# Patient Record
Sex: Female | Born: 1945 | Race: White | Hispanic: No | Marital: Married | Smoking: Never smoker
Health system: Southern US, Community
[De-identification: ages and names within clinical notes are randomized; demographics above are authoritative.]

## PROBLEM LIST (undated history)

## (undated) DIAGNOSIS — J45909 Unspecified asthma, uncomplicated: Secondary | ICD-10-CM

## (undated) DIAGNOSIS — M329 Systemic lupus erythematosus, unspecified: Secondary | ICD-10-CM

## (undated) DIAGNOSIS — N6019 Diffuse cystic mastopathy of unspecified breast: Secondary | ICD-10-CM

## (undated) DIAGNOSIS — I10 Essential (primary) hypertension: Secondary | ICD-10-CM

## (undated) DIAGNOSIS — C801 Malignant (primary) neoplasm, unspecified: Secondary | ICD-10-CM

## (undated) DIAGNOSIS — I4891 Unspecified atrial fibrillation: Secondary | ICD-10-CM

## (undated) DIAGNOSIS — IMO0002 Reserved for concepts with insufficient information to code with codable children: Secondary | ICD-10-CM

## (undated) HISTORY — DX: Diffuse cystic mastopathy of unspecified breast: N60.19

## (undated) HISTORY — PX: BREAST BIOPSY: SHX20

## (undated) HISTORY — DX: Unspecified asthma, uncomplicated: J45.909

## (undated) HISTORY — PX: ABDOMINAL HYSTERECTOMY: SHX81

## (undated) HISTORY — DX: Systemic lupus erythematosus, unspecified: M32.9

## (undated) HISTORY — PX: BREAST EXCISIONAL BIOPSY: SUR124

## (undated) HISTORY — DX: Reserved for concepts with insufficient information to code with codable children: IMO0002

## (undated) HISTORY — PX: FACIAL COSMETIC SURGERY: SHX629

## (undated) HISTORY — PX: AUGMENTATION MAMMAPLASTY: SUR837

## (undated) HISTORY — PX: BREAST CYST ASPIRATION: SHX578

---

## 2000-03-21 HISTORY — PX: MITRAL VALVE REPAIR: SHX2039

## 2004-03-21 HISTORY — PX: CHOLECYSTECTOMY: SHX55

## 2004-03-30 ENCOUNTER — Ambulatory Visit: Payer: Self-pay | Admitting: Family Medicine

## 2004-04-15 ENCOUNTER — Inpatient Hospital Stay: Payer: Self-pay | Admitting: Internal Medicine

## 2004-04-23 ENCOUNTER — Ambulatory Visit: Payer: Self-pay | Admitting: Family Medicine

## 2004-10-05 ENCOUNTER — Ambulatory Visit: Payer: Self-pay | Admitting: General Surgery

## 2005-12-01 ENCOUNTER — Ambulatory Visit: Payer: Self-pay | Admitting: General Surgery

## 2006-09-15 ENCOUNTER — Ambulatory Visit: Payer: Self-pay | Admitting: Family Medicine

## 2006-09-20 ENCOUNTER — Other Ambulatory Visit: Payer: Self-pay

## 2006-09-20 ENCOUNTER — Ambulatory Visit: Payer: Self-pay | Admitting: Gastroenterology

## 2006-09-28 ENCOUNTER — Ambulatory Visit: Payer: Self-pay | Admitting: Gastroenterology

## 2006-12-13 ENCOUNTER — Ambulatory Visit: Payer: Self-pay | Admitting: Internal Medicine

## 2007-01-11 ENCOUNTER — Ambulatory Visit: Payer: Self-pay | Admitting: General Surgery

## 2007-02-01 ENCOUNTER — Emergency Department: Payer: Self-pay | Admitting: Emergency Medicine

## 2007-02-06 ENCOUNTER — Ambulatory Visit: Payer: Self-pay | Admitting: Unknown Physician Specialty

## 2007-02-09 ENCOUNTER — Ambulatory Visit: Payer: Self-pay | Admitting: Cardiology

## 2007-02-14 ENCOUNTER — Ambulatory Visit: Payer: Self-pay | Admitting: Cardiology

## 2007-03-22 HISTORY — PX: POLYPECTOMY: SHX149

## 2007-10-30 ENCOUNTER — Ambulatory Visit: Payer: Self-pay | Admitting: Gastroenterology

## 2007-12-24 ENCOUNTER — Ambulatory Visit: Payer: Self-pay | Admitting: Family Medicine

## 2008-01-04 ENCOUNTER — Ambulatory Visit: Payer: Self-pay | Admitting: Cardiology

## 2008-02-19 ENCOUNTER — Ambulatory Visit: Payer: Self-pay | Admitting: General Surgery

## 2008-06-30 ENCOUNTER — Ambulatory Visit: Payer: Self-pay | Admitting: Emergency Medicine

## 2008-07-06 IMAGING — CR RIGHT ANKLE - COMPLETE 3+ VIEW
1 series · 5 of 5 positions shown · non-contrast
Comparison: none

REASON FOR EXAM: fall, pain and deformity
COMMENTS:

[Series 1: view not recorded · 0.17mm/px · 5 of 5 slices shown]
[im 1/5]
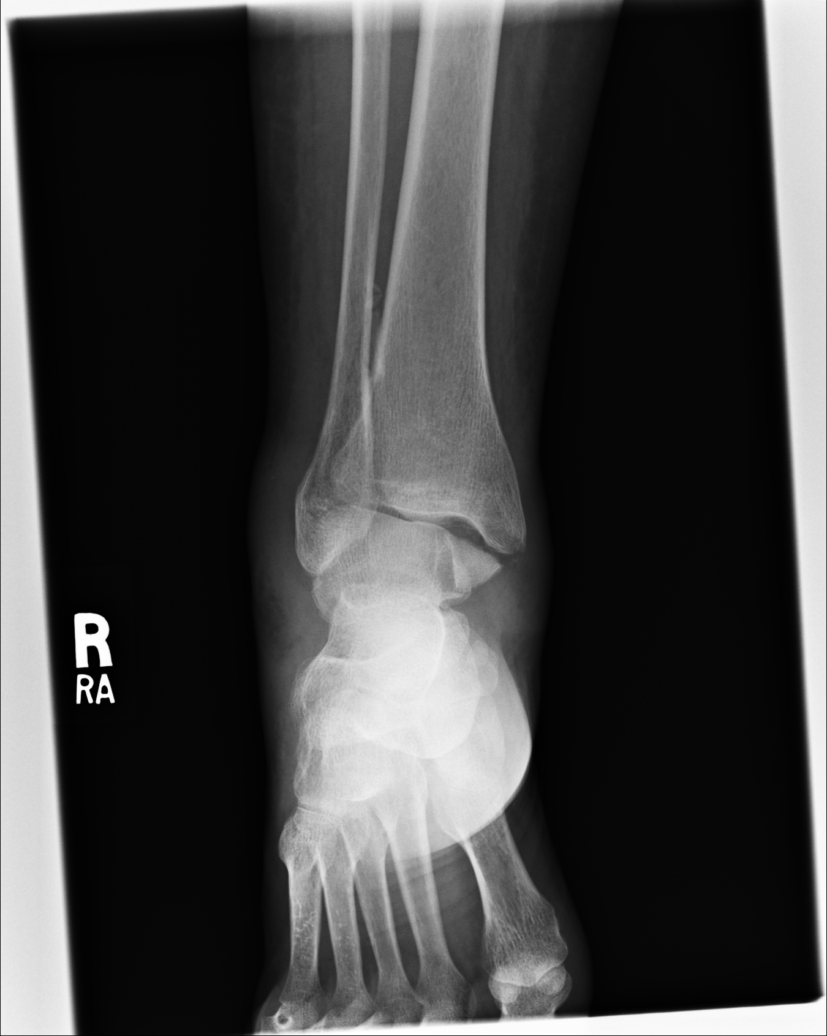
[im 2/5]
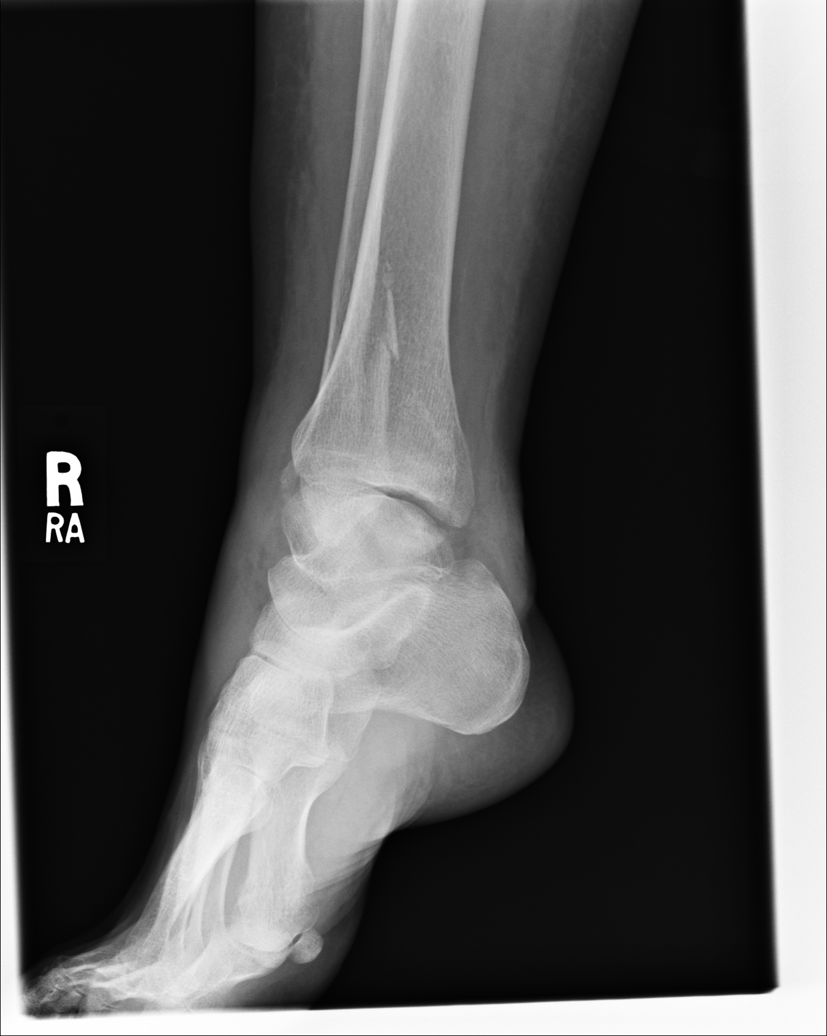
[im 3/5]
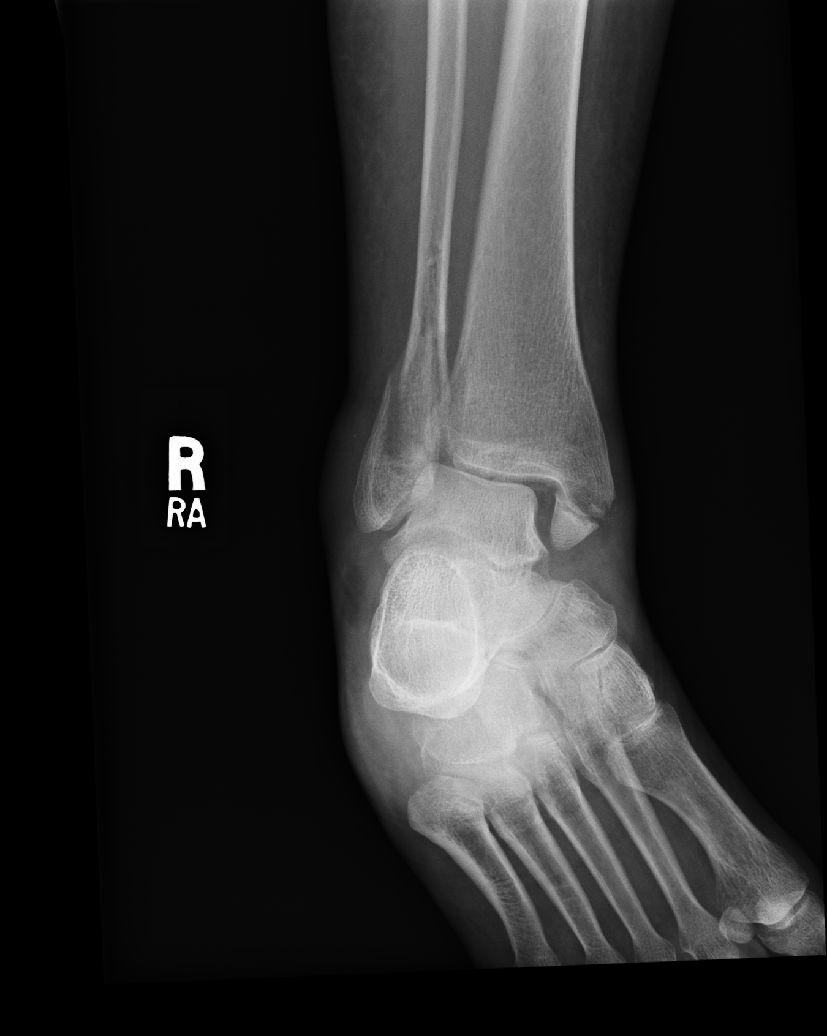
[im 4/5]
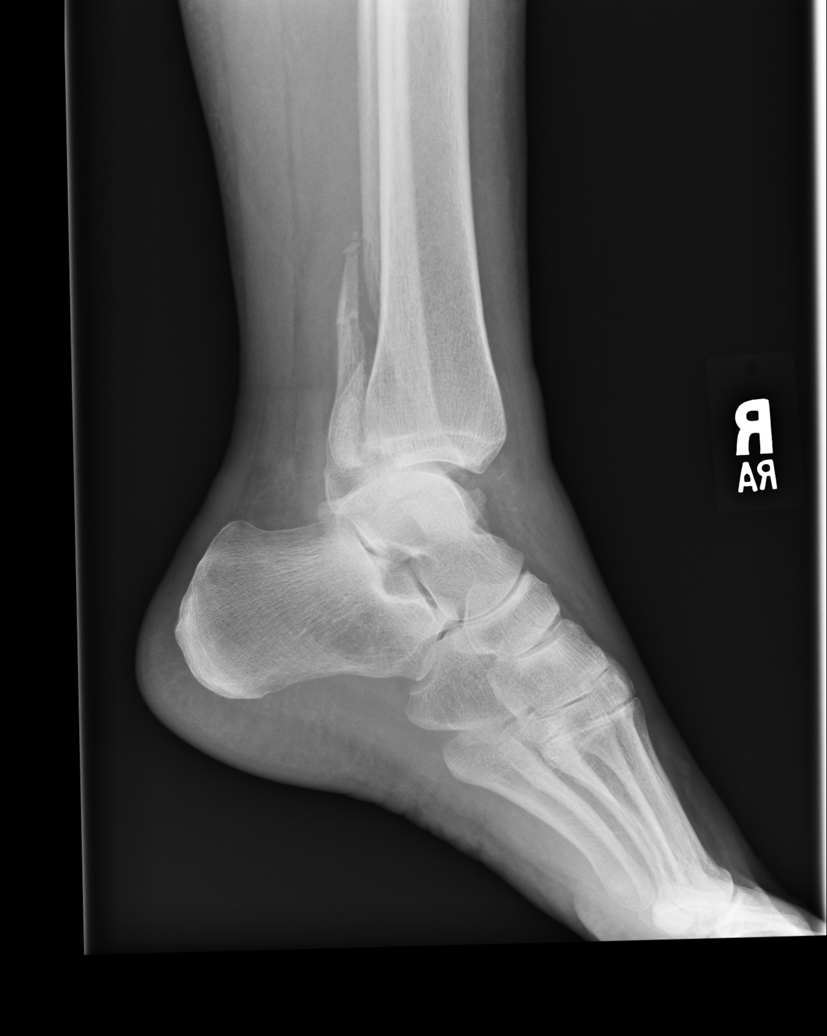
[im 5/5]
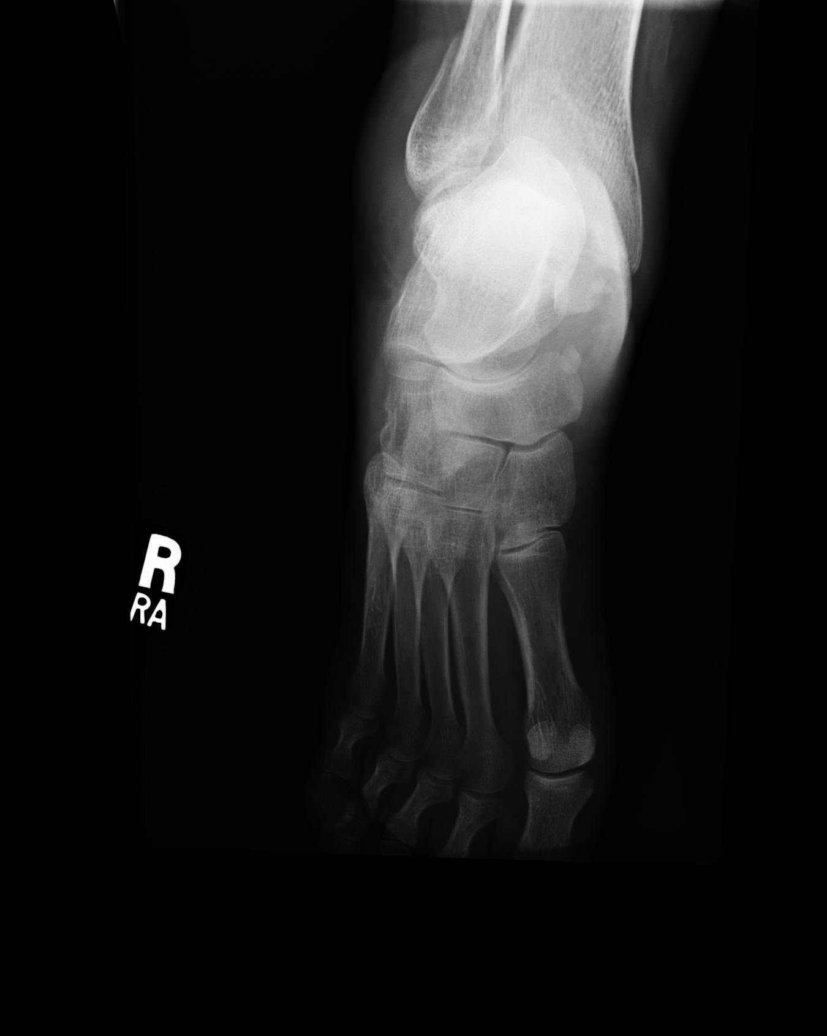

[5 of 5 positions shown; findings below may reference images not displayed]

PROCEDURE:     DXR - DXR ANKLE RIGHT COMPLETE  - February 01, 2007 [DATE]

RESULT:     There is soft tissue swelling diffusely over the ankle. The
patient sustained a fracture of the distal fibular metadiaphysis. There is
also a medial malleolar fracture. The ankle joint mortise is disrupted. The
talar dome is intact. A posterior malleolar fracture is strongly suspected
as well. The calcaneus is grossly intact.
IMPRESSION: The patient has sustained a trimalleolar fracture of the RIGHT ankle with
dislocation. A large amount of overlying soft tissue swelling is present.

## 2008-08-26 ENCOUNTER — Inpatient Hospital Stay: Payer: Self-pay | Admitting: Internal Medicine

## 2009-02-20 ENCOUNTER — Ambulatory Visit: Payer: Self-pay | Admitting: General Surgery

## 2009-02-26 ENCOUNTER — Ambulatory Visit: Payer: Self-pay | Admitting: General Surgery

## 2010-03-01 ENCOUNTER — Ambulatory Visit: Payer: Self-pay | Admitting: General Surgery

## 2010-06-04 ENCOUNTER — Ambulatory Visit: Payer: Self-pay | Admitting: Family Medicine

## 2010-11-24 ENCOUNTER — Ambulatory Visit: Payer: Self-pay | Admitting: Family Medicine

## 2011-01-05 ENCOUNTER — Ambulatory Visit: Payer: Self-pay | Admitting: Gastroenterology

## 2011-03-03 ENCOUNTER — Ambulatory Visit: Payer: Self-pay | Admitting: General Surgery

## 2011-03-22 HISTORY — PX: COLONOSCOPY: SHX174

## 2012-02-11 ENCOUNTER — Ambulatory Visit: Payer: Self-pay | Admitting: Internal Medicine

## 2012-03-07 ENCOUNTER — Ambulatory Visit: Payer: Self-pay | Admitting: General Surgery

## 2012-08-24 ENCOUNTER — Ambulatory Visit: Payer: Self-pay

## 2012-09-12 ENCOUNTER — Encounter: Payer: Self-pay | Admitting: *Deleted

## 2013-03-08 ENCOUNTER — Ambulatory Visit: Payer: Self-pay | Admitting: General Surgery

## 2013-03-11 ENCOUNTER — Encounter: Payer: Self-pay | Admitting: General Surgery

## 2013-03-19 ENCOUNTER — Encounter: Payer: Self-pay | Admitting: General Surgery

## 2013-03-19 ENCOUNTER — Ambulatory Visit (INDEPENDENT_AMBULATORY_CARE_PROVIDER_SITE_OTHER): Payer: Medicare PPO | Admitting: General Surgery

## 2013-03-19 VITALS — BP 140/78 | HR 78 | Resp 14 | Ht 69.0 in | Wt 207.0 lb

## 2013-03-19 DIAGNOSIS — Z803 Family history of malignant neoplasm of breast: Secondary | ICD-10-CM

## 2013-03-19 DIAGNOSIS — N6019 Diffuse cystic mastopathy of unspecified breast: Secondary | ICD-10-CM

## 2013-03-19 NOTE — Patient Instructions (Signed)
Continue self breast exams. Call office for any new breast issues or concerns. Follow up in one year with bilateral screening mammogram and office visit 

## 2013-03-19 NOTE — Progress Notes (Signed)
Patient ID: Kelli Gonzalez, female   DOB: 08/02/1945, 67 y.o.   MRN: [id number]  Chief Complaint  Patient presents with  . Follow-up    mammogram    HPI Kelli Gonzalez is a 67 y.o. female.  who presents for \\her  follow up breast evaluation. The most recent mammogram was done on 03-08-13.  Patient does perform regular self breast checks and gets regular mammograms done.  No new breast issues.  HPI  Past Medical History  Diagnosis Date  . Lupus   . Asthma   . Diffuse cystic mastopathy     Past Surgical History  Procedure Laterality Date  . Colonoscopy    . Polypectomy  2009  . Cholecystectomy  2006  . Mitral valve repair  2002  . Facial cosmetic surgery    . Abdominal hysterectomy    . Breast biopsy      Family History  Problem Relation Age of Onset  . Cancer Mother   . Heart disease Father     Social History History  Substance Use Topics  . Smoking status: Never Smoker   . Smokeless tobacco: Never Used  . Alcohol Use: No    Allergies  Allergen Reactions  . Latex Rash    blisters    Current Outpatient Prescriptions  Medication Sig Dispense Refill  . Metoprolol Succinate (TOPROL XL Gonzalez) Take by mouth daily.      Marland Kitchen warfarin (COUMADIN) 5 MG tablet Take 5 mg by mouth daily at 6 PM.       No current facility-administered medications for this visit.    Review of Systems Review of Systems  Constitutional: Negative.   Respiratory: Negative.   Cardiovascular: Negative.     Blood pressure 140/78, pulse 78, resp. rate 14, height 5\' 9"  (1.753 m), weight 207 lb (93.895 kg).  Physical Exam Physical Exam  Constitutional: She is oriented to person, place, and time. She appears well-developed and well-nourished.  Eyes: Conjunctivae are normal. No scleral icterus.  Neck: Neck supple.  Cardiovascular: Normal rate, regular rhythm and normal heart sounds.   Pulmonary/Chest: Effort normal and breath sounds normal. Right breast exhibits no inverted nipple, no mass, no  nipple discharge, no skin change and no tenderness. Left breast exhibits no inverted nipple, no mass, no nipple discharge, no skin change and no tenderness.  Lymphadenopathy:    She has no cervical adenopathy.    She has no axillary adenopathy.  Neurological: She is alert and oriented to person, place, and time.  Skin: Skin is warm and dry.    Data Reviewed Mammogram reviewed and stable.  Assessment    Stable physical exam. Pt with FH of breast ca, FCD    Plan    Follow up in one year with bilateral screening mammogram and office visit.         Kedar Sedano G 03/20/2013, 4:13 PM

## 2013-03-20 ENCOUNTER — Encounter: Payer: Self-pay | Admitting: General Surgery

## 2013-03-20 DIAGNOSIS — N6019 Diffuse cystic mastopathy of unspecified breast: Secondary | ICD-10-CM | POA: Insufficient documentation

## 2013-03-20 DIAGNOSIS — Z803 Family history of malignant neoplasm of breast: Secondary | ICD-10-CM | POA: Insufficient documentation

## 2013-10-30 ENCOUNTER — Ambulatory Visit: Payer: Self-pay | Admitting: Family Medicine

## 2014-01-20 ENCOUNTER — Encounter: Payer: Self-pay | Admitting: General Surgery

## 2014-03-11 ENCOUNTER — Ambulatory Visit: Payer: Self-pay | Admitting: General Surgery

## 2014-03-17 ENCOUNTER — Encounter: Payer: Self-pay | Admitting: General Surgery

## 2014-03-19 ENCOUNTER — Ambulatory Visit: Payer: Medicare PPO | Admitting: General Surgery

## 2014-03-27 ENCOUNTER — Ambulatory Visit (INDEPENDENT_AMBULATORY_CARE_PROVIDER_SITE_OTHER): Payer: Medicare PPO | Admitting: General Surgery

## 2014-03-27 ENCOUNTER — Encounter: Payer: Self-pay | Admitting: General Surgery

## 2014-03-27 VITALS — BP 128/74 | HR 62 | Resp 12 | Ht 69.0 in | Wt 204.0 lb

## 2014-03-27 DIAGNOSIS — N6019 Diffuse cystic mastopathy of unspecified breast: Secondary | ICD-10-CM

## 2014-03-27 DIAGNOSIS — Z803 Family history of malignant neoplasm of breast: Secondary | ICD-10-CM

## 2014-03-27 NOTE — Patient Instructions (Addendum)
Continue self breast exams. Call office for any new breast issues or concerns. The patient has been asked to return to the office in one year with a bilateral screeening mammogram.

## 2014-03-27 NOTE — Progress Notes (Signed)
Patient ID: Kelli Gonzalez, female   DOB: [birth year], 69 y.o.   MRN: [id number]  Chief Complaint  Patient presents with  . Follow-up    mammogram    HPI Kelli Gonzalez is a 69 y.o. female.  who presents for her follow up breast evaluation. The most recent mammogram was done on 03-11-14.  Patient does perform regular self breast checks and gets regular mammograms done.  No new breast issues.  HPI  Past Medical History  Diagnosis Date  . Lupus   . Asthma   . Diffuse cystic mastopathy     Past Surgical History  Procedure Laterality Date  . Colonoscopy    . Polypectomy  2009  . Cholecystectomy  2006  . Mitral valve repair  2002  . Facial cosmetic surgery    . Abdominal hysterectomy    . Breast biopsy      Family History  Problem Relation Age of Onset  . Cancer Mother 59    breast  . Heart disease Father     Social History History  Substance Use Topics  . Smoking status: Never Smoker   . Smokeless tobacco: Never Used  . Alcohol Use: No    Allergies  Allergen Reactions  . Latex Rash    blisters    Current Outpatient Prescriptions  Medication Sig Dispense Refill  . Metoprolol Succinate (TOPROL XL PO) Take 100 mg by mouth daily.     . pravastatin (PRAVACHOL) 40 MG tablet Take 40 mg by mouth daily.    Marland Kitchen warfarin (COUMADIN) 5 MG tablet Take 5 mg by mouth daily at 6 PM.     No current facility-administered medications for this visit.    Review of Systems Review of Systems  Constitutional: Negative.   Respiratory: Negative.   Cardiovascular: Negative.     Blood pressure 128/74, pulse 62, resp. rate 12, height 5\' 9"  (1.753 m), weight 204 lb (92.534 kg).  Physical Exam Physical Exam  Constitutional: She is oriented to person, place, and time. She appears well-developed and well-nourished.  Eyes: Conjunctivae are normal. No scleral icterus.  Neck: Neck supple.  Cardiovascular: Normal rate, regular rhythm and normal heart sounds.   Pulmonary/Chest: Effort  normal and breath sounds normal. Right breast exhibits no inverted nipple, no mass, no nipple discharge, no skin change and no tenderness. Left breast exhibits no inverted nipple, no mass, no nipple discharge, no skin change and no tenderness.  Lymphadenopathy:    She has no cervical adenopathy.    She has no axillary adenopathy.  Neurological: She is alert and oriented to person, place, and time.  Skin: Skin is warm and dry.    Data Reviewed Mammogram reviewed and stable.  Assessment    Stable physical exam. FH of breast cancer    Plan    The patient has been asked to return to the office in one year with a bilateral screeening mammogram.       Kelli Gonzalez G 03/28/2014, 6:11 AM

## 2014-03-28 ENCOUNTER — Encounter: Payer: Self-pay | Admitting: General Surgery

## 2015-01-12 ENCOUNTER — Other Ambulatory Visit: Payer: Self-pay

## 2015-01-12 DIAGNOSIS — Z1231 Encounter for screening mammogram for malignant neoplasm of breast: Secondary | ICD-10-CM

## 2015-01-14 ENCOUNTER — Other Ambulatory Visit: Payer: Self-pay

## 2015-01-14 DIAGNOSIS — Z1231 Encounter for screening mammogram for malignant neoplasm of breast: Secondary | ICD-10-CM

## 2015-03-17 ENCOUNTER — Ambulatory Visit
Admission: RE | Admit: 2015-03-17 | Discharge: 2015-03-17 | Disposition: A | Discharge status: Home / Self Care | Payer: Medicare PPO | Source: Ambulatory Visit | Attending: General Surgery | Admitting: General Surgery

## 2015-03-17 DIAGNOSIS — Z1231 Encounter for screening mammogram for malignant neoplasm of breast: Secondary | ICD-10-CM | POA: Insufficient documentation

## 2015-03-17 HISTORY — DX: Malignant (primary) neoplasm, unspecified: C80.1

## 2015-03-25 ENCOUNTER — Ambulatory Visit (INDEPENDENT_AMBULATORY_CARE_PROVIDER_SITE_OTHER): Payer: Medicare PPO | Admitting: General Surgery

## 2015-03-25 ENCOUNTER — Encounter: Payer: Self-pay | Admitting: General Surgery

## 2015-03-25 VITALS — BP 130/74 | HR 78 | Resp 12 | Ht 69.0 in | Wt 203.0 lb

## 2015-03-25 DIAGNOSIS — N6019 Diffuse cystic mastopathy of unspecified breast: Secondary | ICD-10-CM | POA: Diagnosis not present

## 2015-03-25 NOTE — Patient Instructions (Addendum)
Patient will be asked to return to the office in one year with a bilateral screening mammogram.  Continue self breast exams. Call office for any new breast issues or concerns.  

## 2015-03-25 NOTE — Progress Notes (Signed)
Patient ID: Kelli Gonzalez, female   DOB: May 24, 1945, 70 y.o.   MRN: [id number]  Chief Complaint  Patient presents with  . Follow-up    mammogram    HPI Kelli Gonzalez is a 70 y.o. female who presents for a breast evaluation. The most recent mammogram was done on 03/17/15.  Patient does perform regular self breast checks and gets regular mammograms done.  Has had some arthritis type symptoms off and on, otherwise doing well I have reviewed the history of present illness with the patient.  HPI  Past Medical History  Diagnosis Date  . Lupus ([place])   . Asthma   . Diffuse cystic mastopathy   . Cancer ([place])     skin ca    Past Surgical History  Procedure Laterality Date  . Colonoscopy    . Polypectomy  2009  . Cholecystectomy  2006  . Mitral valve repair  2002  . Facial cosmetic surgery    . Abdominal hysterectomy    . Breast biopsy Left     neg  . Breast cyst aspiration Right     neg    Family History  Problem Relation Age of Onset  . Cancer Mother 70    breast  . Heart disease Father     Social History Social History  Substance Use Topics  . Smoking status: Never Smoker   . Smokeless tobacco: Never Used  . Alcohol Use: No    Allergies  Allergen Reactions  . Latex Rash    blisters    Current Outpatient Prescriptions  Medication Sig Dispense Refill  . Metoprolol Succinate (TOPROL XL PO) Take 100 mg by mouth daily.     . pravastatin (PRAVACHOL) 40 MG tablet Take 40 mg by mouth daily.    Marland Kitchen warfarin (COUMADIN) 5 MG tablet Take 5 mg by mouth daily at 6 PM.     No current facility-administered medications for this visit.    Review of Systems Review of Systems  Constitutional: Negative.   Respiratory: Negative.   Cardiovascular: Negative.     Blood pressure 130/74, pulse 78, resp. rate 12, height 5\' 9"  (1.753 m), weight 203 lb (92.08 kg).  Physical Exam Physical Exam  Constitutional: She is oriented to person, place, and time. She appears  well-developed and well-nourished.  Eyes: Conjunctivae are normal. No scleral icterus.  Neck: Neck supple.  Cardiovascular: Normal rate, regular rhythm and normal heart sounds.   Pulmonary/Chest: Effort normal and breath sounds normal. Right breast exhibits no inverted nipple, no mass, no nipple discharge, no skin change and no tenderness. Left breast exhibits no inverted nipple, no mass, no nipple discharge, no skin change and no tenderness.  Abdominal: Soft. Normal appearance and bowel sounds are normal. There is no hepatomegaly. There is no tenderness.  Lymphadenopathy:    She has no cervical adenopathy.    She has no axillary adenopathy.  Neurological: She is alert and oriented to person, place, and time.  Skin: Skin is warm and dry.    Data Reviewed Mammogram reviewed-stable  Assessment    Stable physical exam. FH of breast cancer. History of breast cysts    Plan    Patient will be asked to return to the office in one year with a bilateral screening mammogram.Continue self breast exams. Call office for any new breast issues or concerns.  This information has been scribed by Gaspar Cola CMA. PCP:  French Ana 03/25/2015, 12:54 PM

## 2015-09-09 ENCOUNTER — Ambulatory Visit
Admission: RE | Admit: 2015-09-09 | Discharge: 2015-09-09 | Disposition: A | Discharge status: Home / Self Care | Payer: Medicare PPO | Source: Ambulatory Visit | Attending: Family Medicine | Admitting: Family Medicine

## 2015-09-09 ENCOUNTER — Other Ambulatory Visit: Payer: Self-pay | Admitting: Family Medicine

## 2015-09-09 DIAGNOSIS — M1712 Unilateral primary osteoarthritis, left knee: Secondary | ICD-10-CM | POA: Insufficient documentation

## 2015-09-09 DIAGNOSIS — S8002XA Contusion of left knee, initial encounter: Secondary | ICD-10-CM

## 2015-09-09 DIAGNOSIS — X58XXXA Exposure to other specified factors, initial encounter: Secondary | ICD-10-CM | POA: Insufficient documentation

## 2016-02-04 ENCOUNTER — Other Ambulatory Visit: Payer: Self-pay

## 2016-02-04 DIAGNOSIS — Z1231 Encounter for screening mammogram for malignant neoplasm of breast: Secondary | ICD-10-CM

## 2016-03-22 ENCOUNTER — Ambulatory Visit
Admission: RE | Admit: 2016-03-22 | Discharge: 2016-03-22 | Disposition: A | Discharge status: Home / Self Care | Payer: Medicare HMO | Source: Ambulatory Visit | Attending: General Surgery | Admitting: General Surgery

## 2016-03-22 DIAGNOSIS — Z1231 Encounter for screening mammogram for malignant neoplasm of breast: Secondary | ICD-10-CM | POA: Insufficient documentation

## 2016-03-23 ENCOUNTER — Encounter: Payer: Self-pay | Admitting: *Deleted

## 2016-03-29 ENCOUNTER — Encounter: Payer: Self-pay | Admitting: General Surgery

## 2016-03-29 ENCOUNTER — Ambulatory Visit (INDEPENDENT_AMBULATORY_CARE_PROVIDER_SITE_OTHER): Payer: Medicare HMO | Admitting: General Surgery

## 2016-03-29 VITALS — BP 132/66 | HR 72 | Resp 14 | Ht 69.0 in | Wt 200.0 lb

## 2016-03-29 DIAGNOSIS — Z803 Family history of malignant neoplasm of breast: Secondary | ICD-10-CM | POA: Diagnosis not present

## 2016-03-29 DIAGNOSIS — N6019 Diffuse cystic mastopathy of unspecified breast: Secondary | ICD-10-CM

## 2016-03-29 NOTE — Progress Notes (Signed)
Patient ID: Kelli Gonzalez, female   DOB: 23-Sep-1945, 71 y.o.   MRN: [id number]  Chief Complaint  Patient presents with  . Follow-up    HPI Kelli Gonzalez is a 71 y.o. female.  who presents for a breast evaluation. The most recent mammogram was done on 03-22-16.  Patient does perform regular self breast checks and gets regular mammograms done.  I have reviewed the history of present illness with the patient.   No new breast issues.  HPI  Past Medical History:  Diagnosis Date  . Asthma   . Cancer ([place])    skin ca  . Diffuse cystic mastopathy   . Lupus     Past Surgical History:  Procedure Laterality Date  . ABDOMINAL HYSTERECTOMY    . BREAST BIOPSY Left    neg  . BREAST CYST ASPIRATION Right    neg  . CHOLECYSTECTOMY  2006  . COLONOSCOPY  2013   Dr Dionne Milo   . FACIAL COSMETIC SURGERY    . MITRAL VALVE REPAIR  2002  . POLYPECTOMY  2009    Family History  Problem Relation Age of Onset  . Cancer Mother 55    breast  . Breast cancer Mother   . Heart disease Father     Social History Social History  Substance Use Topics  . Smoking status: Never Smoker  . Smokeless tobacco: Never Used  . Alcohol use No    Allergies  Allergen Reactions  . Latex Rash    blisters    Current Outpatient Prescriptions  Medication Sig Dispense Refill  . ALPRAZolam (XANAX) 0.5 MG tablet Take 0.5 mg by mouth at bedtime.    . Metoprolol Succinate (TOPROL XL PO) Take 100 mg by mouth daily.     . pravastatin (PRAVACHOL) 40 MG tablet Take 40 mg by mouth daily.    Marland Kitchen warfarin (COUMADIN) 5 MG tablet Take 5 mg by mouth daily at 6 PM.     No current facility-administered medications for this visit.     Review of Systems Review of Systems  Constitutional: Negative.   Respiratory: Negative.   Cardiovascular: Negative.     Blood pressure 132/66, pulse 72, resp. rate 14, height 5\' 9"  (1.753 m), weight 200 lb (90.7 kg).  Physical Exam Physical Exam  Constitutional: She is oriented  to person, place, and time. She appears well-developed and well-nourished.  HENT:  Mouth/Throat: Oropharynx is clear and moist.  Eyes: Conjunctivae are normal. No scleral icterus.  Neck: Neck supple.  Cardiovascular: Normal rate, regular rhythm and normal heart sounds.   Pulmonary/Chest: Effort normal and breath sounds normal. Right breast exhibits no inverted nipple, no mass, no nipple discharge, no skin change and no tenderness. Left breast exhibits no inverted nipple, no mass, no nipple discharge, no skin change and no tenderness.  Abdominal: Soft. There is no tenderness.  Lymphadenopathy:    She has no cervical adenopathy.    She has no axillary adenopathy.  Neurological: She is alert and oriented to person, place, and time.  Skin: Skin is warm and dry.  Psychiatric: Her behavior is normal.    Data Reviewed  Mammogram reviewed and stable.  Assessment    Stable physical exam. Family history of breast cancer. History of breast cysts.    Plan    Follow up colonoscopy has been scheduled through her PCP. Patient will be asked to return to the office in one year with a bilateral screening mammogram, she wishes to follow with Dr Bary Castilla.  Continue self breast exams.  Call office for any new breast issues or concerns      This information has been scribed by Karie Fetch RN, BSN,BC.  Tangia Pinard G 03/29/2016, 4:29 PM

## 2016-03-29 NOTE — Patient Instructions (Addendum)
The patient is aware to call back for any questions or concerns. Patient will be asked to return to the office in one year with a bilateral screening mammogram. 

## 2016-10-14 ENCOUNTER — Other Ambulatory Visit: Payer: Self-pay | Admitting: Family Medicine

## 2016-11-15 ENCOUNTER — Encounter: Payer: Self-pay | Admitting: General Surgery

## 2016-11-15 ENCOUNTER — Ambulatory Visit (INDEPENDENT_AMBULATORY_CARE_PROVIDER_SITE_OTHER): Payer: Medicare HMO | Admitting: General Surgery

## 2016-11-15 ENCOUNTER — Other Ambulatory Visit: Payer: Self-pay | Admitting: Cardiology

## 2016-11-15 VITALS — BP 122/70 | HR 72 | Resp 12 | Ht 69.0 in | Wt 192.0 lb

## 2016-11-15 DIAGNOSIS — L989 Disorder of the skin and subcutaneous tissue, unspecified: Secondary | ICD-10-CM | POA: Diagnosis not present

## 2016-11-15 NOTE — Progress Notes (Signed)
Patient ID: Kelli Gonzalez, female   DOB: 12/14/1945, 71 y.o.   MRN: [id number]  Chief Complaint  Patient presents with  . Other    HPI Kelli Gonzalez is a 71 y.o. female here today for a evaluation of right nipple mole. Patient noticed this area about a week ago. No pain but has change in size in the last three days.  HPI  Past Medical History:  Diagnosis Date  . Asthma   . Cancer ([place])    skin ca  . Diffuse cystic mastopathy   . Lupus     Past Surgical History:  Procedure Laterality Date  . ABDOMINAL HYSTERECTOMY    . BREAST BIOPSY Left    neg  . BREAST CYST ASPIRATION Right    neg  . CHOLECYSTECTOMY  2006  . COLONOSCOPY  2013   Dr Dionne Milo   . FACIAL COSMETIC SURGERY    . MITRAL VALVE REPAIR  2002  . POLYPECTOMY  2009    Family History  Problem Relation Age of Onset  . Cancer Mother 38       breast  . Breast cancer Mother   . Heart disease Father     Social History Social History  Substance Use Topics  . Smoking status: Never Smoker  . Smokeless tobacco: Never Used  . Alcohol use No    Allergies  Allergen Reactions  . Latex Rash    blisters    Current Outpatient Prescriptions  Medication Sig Dispense Refill  . ALPRAZolam (XANAX) 0.5 MG tablet Take 0.5 mg by mouth at bedtime.    . Metoprolol Succinate (TOPROL XL PO) Take 100 mg by mouth daily.     . pravastatin (PRAVACHOL) 40 MG tablet Take 40 mg by mouth daily.    Marland Kitchen warfarin (COUMADIN) 5 MG tablet Take 5 mg by mouth daily at 6 PM.     No current facility-administered medications for this visit.     Review of Systems Review of Systems  Blood pressure 122/70, pulse 72, resp. rate 12, height 5\' 9"  (1.753 m), weight 192 lb (87.1 kg).  Physical Exam Physical Exam  Constitutional: She is oriented to person, place, and time. She appears well-developed and well-nourished.  Pulmonary/Chest:    Neurological: She is alert and oriented to person, place, and time.  Skin: Skin is warm and dry.     Data Reviewed Prior notes reviewed  Assessment      Right breast skin lesion. This new and has shown very quickly  Plan     Patient to return for right breast skin lesion excision .     HPI, Physical Exam, Assessment and Plan have been scribed under the direction and in the presence of Mckinley Jewel, MD  Gaspar Cola, CMA I have completed the exam and reviewed the above documentation for accuracy and completeness.  I agree with the above.  Haematologist has been used and any errors in dictation or transcription are unintentional.  Cherree Conerly G. Jamal Collin, M.D., F.A.C.S.     Junie Panning G 11/15/2016, 7:08 PM

## 2016-11-15 NOTE — Patient Instructions (Signed)
Patient to return for right breast excision .

## 2016-11-17 ENCOUNTER — Other Ambulatory Visit: Payer: Self-pay | Admitting: Cardiology

## 2016-11-17 DIAGNOSIS — M7989 Other specified soft tissue disorders: Secondary | ICD-10-CM

## 2016-11-18 ENCOUNTER — Ambulatory Visit
Admission: RE | Admit: 2016-11-18 | Discharge: 2016-11-18 | Disposition: A | Discharge status: Home / Self Care | Payer: Medicare HMO | Source: Ambulatory Visit | Attending: Cardiology | Admitting: Cardiology

## 2016-11-18 DIAGNOSIS — M7989 Other specified soft tissue disorders: Secondary | ICD-10-CM | POA: Diagnosis not present

## 2016-11-18 DIAGNOSIS — I82442 Acute embolism and thrombosis of left tibial vein: Secondary | ICD-10-CM | POA: Insufficient documentation

## 2016-11-18 DIAGNOSIS — I4891 Unspecified atrial fibrillation: Secondary | ICD-10-CM | POA: Insufficient documentation

## 2016-11-18 DIAGNOSIS — M7122 Synovial cyst of popliteal space [Baker], left knee: Secondary | ICD-10-CM | POA: Insufficient documentation

## 2016-11-22 ENCOUNTER — Ambulatory Visit (INDEPENDENT_AMBULATORY_CARE_PROVIDER_SITE_OTHER): Payer: Medicare HMO | Admitting: Vascular Surgery

## 2016-11-22 ENCOUNTER — Encounter (INDEPENDENT_AMBULATORY_CARE_PROVIDER_SITE_OTHER): Payer: Self-pay | Admitting: Vascular Surgery

## 2016-11-22 ENCOUNTER — Ambulatory Visit: Payer: Medicare HMO | Admitting: General Surgery

## 2016-11-22 ENCOUNTER — Telehealth: Payer: Self-pay

## 2016-11-22 VITALS — BP 127/76 | HR 57 | Resp 16 | Ht 69.0 in | Wt 189.0 lb

## 2016-11-22 DIAGNOSIS — I82442 Acute embolism and thrombosis of left tibial vein: Secondary | ICD-10-CM | POA: Diagnosis not present

## 2016-11-22 DIAGNOSIS — E785 Hyperlipidemia, unspecified: Secondary | ICD-10-CM | POA: Diagnosis not present

## 2016-11-22 DIAGNOSIS — M7989 Other specified soft tissue disorders: Secondary | ICD-10-CM | POA: Diagnosis not present

## 2016-11-22 DIAGNOSIS — I82409 Acute embolism and thrombosis of unspecified deep veins of unspecified lower extremity: Secondary | ICD-10-CM | POA: Insufficient documentation

## 2016-11-22 NOTE — Patient Instructions (Signed)
Deep Vein Thrombosis A deep vein thrombosis (DVT) is a blood clot (thrombus) that usually occurs in a deep, larger vein of the lower leg or the pelvis, or in an upper extremity such as the arm. These are dangerous and can lead to serious and even life-threatening complications if the clot travels to the lungs. A DVT can damage the valves in your leg veins so that instead of flowing upward, the blood pools in the lower leg. This is called post-thrombotic syndrome, and it can result in pain, swelling, discoloration, and sores on the leg. What are the causes? A DVT is caused by the formation of a blood clot in your leg, pelvis, or arm. Usually, several things contribute to the formation of blood clots. A clot may develop when:  Your blood flow slows down.  Your vein becomes damaged in some way.  You have a condition that makes your blood clot more easily.  What increases the risk? A DVT is more likely to develop in:  People who are older, especially over 60 years of age.  People who are overweight (obese).  People who sit or lie still for a long time, such as during long-distance travel (over 4 hours), bed rest, hospitalization, or during recovery from certain medical conditions like a stroke.  People who do not engage in much physical activity (sedentary lifestyle).  People who have chronic breathing disorders.  People who have a personal or family history of blood clots or blood clotting disease.  People who have peripheral vascular disease (PVD), diabetes, or some types of cancer.  People who have heart disease, especially if the person had a recent heart attack or has congestive heart failure.  People who have neurological diseases that affect the legs (leg paresis).  People who have had a traumatic injury, such as breaking a hip or leg.  People who have recently had major or lengthy surgery, especially on the hip, knee, or abdomen.  People who have had a central line placed  inside a large vein.  People who take medicines that contain the hormone estrogen. These include birth control pills and hormone replacement therapy.  Pregnancy or during childbirth or the postpartum period.  Long plane flights (over 8 hours).  What are the signs or symptoms?  Symptoms of a DVT can include:  Swelling of your leg or arm, especially if one side is much worse.  Warmth and redness of your leg or arm, especially if one side is much worse.  Pain in your arm or leg. If the clot is in your leg, symptoms may be more noticeable or worse when you stand or walk.  A feeling of pins and needles, if the clot is in the arm.  The symptoms of a DVT that has traveled to the lungs (pulmonary embolism, PE) usually start suddenly and include:  Shortness of breath while active or at rest.  Coughing or coughing up blood or blood-tinged mucus.  Chest pain that is often worse with deep breaths.  Rapid or irregular heartbeat.  Feeling light-headed or dizzy.  Fainting.  Feeling anxious.  Sweating.  There may also be pain and swelling in a leg if that is where the blood clot started. These symptoms may represent a serious problem that is an emergency. Do not wait to see if the symptoms will go away. Get medical help right away. Call your local emergency services (911 in the [place] Do not drive yourself to the hospital. How is this diagnosed? Your health   care provider will take a medical history and perform a physical exam. You may also have other tests, including:  Blood tests to assess the clotting properties of your blood.  Imaging tests, such as CT, ultrasound, MRI, X-ray, and other tests to see if you have clots anywhere in your body.  How is this treated? After a DVT is identified, it can be treated. The type of treatment that you receive depends on many factors, such as the cause of your DVT, your risk for bleeding or developing more clots, and other medical conditions that  you have. Sometimes, a combination of treatments is necessary. Treatment options may be combined and include:  Monitoring the blood clot with ultrasound.  Taking medicines by mouth, such as newer blood thinners (anticoagulants), thrombolytics, or warfarin.  Taking anticoagulant medicine by injection or through an IV tube.  Wearing compression stockings or using different types ofdevices.  Surgery (rare) to remove the blood clot or to place a filter in your abdomen to stop the blood clot from traveling to your lungs.  Treatments for a DVT are often divided into immediate treatment and long-term treatment (up to 3 months after DVT). You can work with your health care provider to choose the treatment program that is best for you. Follow these instructions at home: If you are taking a newer oral anticoagulant:  Take the medicine every single day at the same time each day.  Understand what foods and drugs interact with this medicine.  Understand that there are no regular blood tests required when using this medicine.  Understand the side effects of this medicine, including excessive bruising or bleeding. Ask your health care provider or pharmacist about other possible side effects. If you are taking warfarin:  Understand how to take warfarin and know which foods can affect how warfarin works in your body.  Understand that it is dangerous to take too much or too little warfarin. Too much warfarin increases the risk of bleeding. Too little warfarin continues to allow the risk for blood clots.  Follow your PT and INR blood testing schedule. The PT and INR results allow your health care provider to adjust your dose of warfarin. It is very important that you have your PT and INR tested as often as told by your health care provider.  Avoid major changes in your diet, or tell your health care provider before you change your diet. Arrange a visit with a registered dietitian to answer your  questions. Many foods, especially foods that are high in vitamin K, can interfere with warfarin and affect the PT and INR results. Eat a consistent amount of foods that are high in vitamin K, such as: ? Spinach, kale, broccoli, cabbage, collard greens, turnip greens, Brussels sprouts, peas, cauliflower, seaweed, and parsley. ? Beef liver and pork liver. ? Green tea. ? Soybean oil.  Tell your health care provider about any and all medicines, vitamins, and supplements that you take, including aspirin and other over-the-counter anti-inflammatory medicines. Be especially cautious with aspirin and anti-inflammatory medicines. Do not take those before you ask your health care provider if it is safe to do so. This is important because many medicines can interfere with warfarin and affect the PT and INR results.  Do not start or stop taking any over-the-counter or prescription medicine unless your health care provider or pharmacist tells you to do so. If you take warfarin, you will also need to do these things:  Hold pressure over cuts for longer than   usual.  Tell your dentist and other health care providers that you are taking warfarin before you have any procedures in which bleeding may occur.  Avoid alcohol or drink very small amounts. Tell your health care provider if you change your alcohol intake.  Do not use tobacco products, including cigarettes, chewing tobacco, and e-cigarettes. If you need help quitting, ask your health care provider.  Avoid contact sports.  General instructions  Take over-the-counter and prescription medicines only as told by your health care provider. Anticoagulant medicines can have side effects, including easy bruising and difficulty stopping bleeding. If you are prescribed an anticoagulant, you will also need to do these things: ? Hold pressure over cuts for longer than usual. ? Tell your dentist and other health care providers that you are taking anticoagulants  before you have any procedures in which bleeding may occur. ? Avoid contact sports.  Wear a medical alert bracelet or carry a medical alert card that says you have had a PE.  Ask your health care provider how soon you can go back to your normal activities. Stay active to prevent new blood clots from forming.  Make sure to exercise while traveling or when you have been sitting or standing for a long period of time. It is very important to exercise. Exercise your legs by walking or by tightening and relaxing your leg muscles often. Take frequent walks.  Wear compression stockings as told by your health care provider to help prevent more blood clots from forming.  Do not use tobacco products, including cigarettes, chewing tobacco, and e-cigarettes. If you need help quitting, ask your health care provider.  Keep all follow-up appointments with your health care provider. This is important. How is this prevented? Take these actions to decrease your risk of developing another DVT:  Exercise regularly. For at least 30 minutes every day, engage in: ? Activity that involves moving your arms and legs. ? Activity that encourages good blood flow through your body by increasing your heart rate.  Exercise your arms and legs every hour during long-distance travel (over 4 hours). Drink plenty of water and avoid drinking alcohol while traveling.  Avoid sitting or lying in bed for long periods of time without moving your legs.  Maintain a weight that is appropriate for your height. Ask your health care provider what weight is healthy for you.  If you are a woman who is over 35 years of age, avoid unnecessary use of medicines that contain estrogen. These include birth control pills.  Do not smoke, especially if you take estrogen medicines. If you need help quitting, ask your health care provider.  If you are hospitalized, prevention measures may include:  Early walking after surgery, as soon as your  health care provider says that it is safe.  Receiving anticoagulants to prevent blood clots.If you cannot take anticoagulants, other options may be available, such as wearing compression stockings or using different types of devices.  Get help right away if:  You have new or increased pain, swelling, or redness in an arm or leg.  You have numbness or tingling in an arm or leg.  You have shortness of breath while active or at rest.  You have chest pain.  You have a rapid or irregular heartbeat.  You feel light-headed or dizzy.  You cough up blood.  You notice blood in your vomit, bowel movement, or urine. These symptoms may represent a serious problem that is an emergency. Do not wait to see   if the symptoms will go away. Get medical help right away. Call your local emergency services (911 in the [place] Do not drive yourself to the hospital. This information is not intended to replace advice given to you by your health care provider. Make sure you discuss any questions you have with your health care provider. Document Released: 03/07/2005 Document Revised: 08/13/2015 Document Reviewed: 07/02/2014 Elsevier Interactive Patient Education  2017 [employer]  

## 2016-11-22 NOTE — Assessment & Plan Note (Signed)
The patient has a left lower extremity tibial vein DVT while on anticoagulation. Fortunately, this is a tibial vein and not a proximal vein DVT and this has a very low risk of embolization. If she were not already on anticoagulation, aspirin alone would be reasonable. I would repeat an ultrasound in 3-4 weeks to make sure that there is not been propagation of the DVT. As long as there is not any propagation, no IVC filter would be placed. She should wear compression stockings, elevate her legs, and increase her activity as tolerated.

## 2016-11-22 NOTE — Progress Notes (Signed)
Patient ID: Kelli Gonzalez, female   DOB: 03/16/1946, 71 y.o.   MRN: [id number]  Chief Complaint  Patient presents with  . New Patient (Initial Visit)    dvt    HPI Kelli Gonzalez is a 71 y.o. female.  I am asked to see the patient by Dr. Ubaldo Glassing for evaluation of DVT.  The patient reports Marked swelling in the left leg over several weeks. This actually gotten better over the past weeks and she has been elevating her leg traumatically. She said it was not really painful, but the leg was heavy and very swollen. She denies trauma, injury, or inciting event started the symptoms. She has chronic right leg discoloration and swelling after an injury many years ago, but nothing new. 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 She had an ultrasound performed which showed a DVT in one of the paired peroneal veins on the left leg. No popliteal or proximal DVT was identified. The chronicity of this DVT was also not characterized. Of note, she had a very large Bakers cyst measuring almost 6 cm in diameter in the left knee joint.   Past Medical History:  Diagnosis Date  . Asthma   . Cancer ([place])    skin ca  . Diffuse cystic mastopathy   . Lupus     Past Surgical History:  Procedure Laterality Date  . ABDOMINAL HYSTERECTOMY    . BREAST BIOPSY Left    neg  . BREAST CYST ASPIRATION Right    neg  . CHOLECYSTECTOMY  2006  . COLONOSCOPY  2013   Dr Dionne Milo   . FACIAL COSMETIC SURGERY    . MITRAL VALVE REPAIR  2002  . POLYPECTOMY  2009    Family History  Problem Relation Age of Onset  . Cancer Mother 63       breast  . Breast cancer Mother   . Heart disease Father   No bleeding or clotting disorders  Social History Social History  Substance Use Topics  . Smoking status: Never Smoker  . Smokeless tobacco: Never Used  . Alcohol use No  No IV drug use  Allergies  Allergen Reactions  . Latex Rash    blisters    Current Outpatient Prescriptions    Medication Sig Dispense Refill  . ALPRAZolam (XANAX) 0.5 MG tablet Take 0.5 mg by mouth at bedtime.    . Metoprolol Succinate (TOPROL XL PO) Take 100 mg by mouth daily.     . pravastatin (PRAVACHOL) 40 MG tablet Take 40 mg by mouth daily.    Marland Kitchen warfarin (COUMADIN) 5 MG tablet Take 5 mg by mouth daily at 6 PM.     No current facility-administered medications for this visit.       REVIEW OF SYSTEMS (Negative unless checked)  Constitutional: [] Weight loss  [] Fever  [] Chills Cardiac: [] Chest pain   [] Chest pressure   [] Palpitations   [] Shortness of breath when laying flat   [] Shortness of breath at rest   [] Shortness of breath with exertion. Vascular:  [] Pain in legs with walking   [] Pain in legs at rest   [] Pain in legs when laying flat   [] Claudication   [] Pain in feet when walking  [] Pain in feet at rest  [] Pain in feet when laying flat   [x] History of DVT   [] Phlebitis   [x] Swelling in legs   [x] Varicose veins   [] Non-healing ulcers Pulmonary:   [] Uses home oxygen   []   Productive cough   [] Hemoptysis   [] Wheeze  [] COPD   [] Asthma Neurologic:  [] Dizziness  [] Blackouts   [] Seizures   [] History of stroke   [] History of TIA  [] Aphasia   [] Temporary blindness   [] Dysphagia   [] Weakness or numbness in arms   [] Weakness or numbness in legs Musculoskeletal:  [x] Arthritis   [] Joint swelling   [] Joint pain   [] Low back pain Hematologic:  [] Easy bruising  [] Easy bleeding   [] Hypercoagulable state   [] Anemic  [] Hepatitis Gastrointestinal:  [] Blood in stool   [] Vomiting blood  [] Gastroesophageal reflux/heartburn   [] Abdominal pain Genitourinary:  [] Chronic kidney disease   [] Difficult urination  [] Frequent urination  [] Burning with urination   [] Hematuria Skin:  [] Rashes   [] Ulcers   [] Wounds Psychological:  [] History of anxiety   []  History of major depression.    Physical Exam BP 127/76   Pulse (!) 57   Resp 16   Ht 5\' 9"  (1.753 m)   Wt 85.7 kg (189 lb)   BMI 27.91 kg/m  Gen:  WD/WN,  NAD Head: [place]/AT, No temporalis wasting.  Ear/Nose/Throat: Hearing grossly intact, nares w/o erythema or drainage, oropharynx w/o Erythema/Exudate Eyes: Conjunctiva clear, sclera non-icteric  Neck: trachea midline.  No JVD.  Pulmonary:  Good air movement, clear to auscultation bilaterally.  Cardiac: RRR, mechanical MV click Vascular:  Vessel Right Left  Radial Palpable Palpable                          PT 1+ Palpable 1+ Palpable  DP Palpable Palpable   Gastrointestinal: soft, non-tender/non-distended.  Musculoskeletal: M/S 5/5 throughout.  Extremities without ischemic changes.  No deformity or atrophy. 1+ BLE edema. Neurologic: Sensation grossly intact in extremities.  Symmetrical.  Speech is fluent. Motor exam as listed above. Psychiatric: Judgment intact, Mood & affect appropriate for pt's clinical situation. Dermatologic: No open ulcerations. Marked stasis changes present on the right leg.    Radiology US Venous Img Lower Unilateral Left  Result Date: 11/18/2016 CLINICAL DATA:  Left lower extremity pain and swelling for the past month. History of varicose veins. Evaluate for DVT. EXAM: LEFT LOWER EXTREMITY VENOUS DOPPLER ULTRASOUND TECHNIQUE: Gray-scale sonography with graded compression, as well as color Doppler and duplex ultrasound were performed to evaluate the lower extremity deep venous systems from the level of the common femoral vein and including the common femoral, femoral, profunda femoral, popliteal and calf veins including the posterior tibial, peroneal and gastrocnemius veins when visible. The superficial great saphenous vein was also interrogated. Spectral Doppler was utilized to evaluate flow at rest and with distal augmentation maneuvers in the common femoral, femoral and popliteal veins. COMPARISON:  None. FINDINGS: Contralateral Common Femoral Vein: Respiratory phasicity is normal and symmetric with the symptomatic side. No evidence of thrombus. Normal  compressibility. Common Femoral Vein: No evidence of thrombus. Normal compressibility, respiratory phasicity and response to augmentation. Saphenofemoral Junction: No evidence of thrombus. Normal compressibility and flow on color Doppler imaging. Profunda Femoral Vein: No evidence of thrombus. Normal compressibility and flow on color Doppler imaging. Femoral Vein: No evidence of thrombus. Normal compressibility, respiratory phasicity and response to augmentation. Popliteal Vein: No evidence of thrombus. Normal compressibility, respiratory phasicity and response to augmentation. Calf Veins: The examination is positive for short segment occlusive thrombus within 1 of the paired posterior tibial veins (images 33 and 34). Superficial Great Saphenous Vein: No evidence of thrombus. Normal compressibility and flow on color Doppler imaging. Venous Reflux:  None.  Other Findings: There is an approximately 5.7 x 1.5 x 1.2 cm serpiginous anechoic fluid collection within the left popliteal fossa which is favored to represent a Baker cyst. IMPRESSION: 1. Examination is positive for short-segment occlusive DVT within one of the paired left posterior tibial veins. There is no extension of this distal calf DVT to the more proximal venous system of the left lower extremity. 2. Incidentally noted approximately 5.7 cm baker cyst. Electronically Signed   By: Sandi Mariscal M.D.   On: 11/18/2016 15:16    Labs No results found for this or any previous visit (from the past 2160 hour(s)).  Assessment/Plan:  Swelling of limb On the right, this is likely chronic venous insufficiency after her injury many years ago. On the left, I suspect the Bakers cyst had more to do with a DVT as the Baker cyst was quite large. Recommend compression stockings, leg elevation, and continued Coumadin.  Hyperlipidemia lipid control important in reducing the progression of atherosclerotic disease. Continue statin therapy   DVT (deep venous  thrombosis) (HCC) The patient has a left lower extremity tibial vein DVT while on anticoagulation. Fortunately, this is a tibial vein and not a proximal vein DVT and this has a very low risk of embolization. If she were not already on anticoagulation, aspirin alone would be reasonable. I would repeat an ultrasound in 3-4 weeks to make sure that there is not been propagation of the DVT. As long as there is not any propagation, no IVC filter would be placed. She should wear compression stockings, elevate her legs, and increase her activity as tolerated.      Leotis Pain 11/22/2016, 4:18 PM   This note was created with Dragon medical transcription system.  Any errors from dictation are unintentional.

## 2016-11-22 NOTE — Assessment & Plan Note (Signed)
lipid control important in reducing the progression of atherosclerotic disease. Continue statin therapy  

## 2016-11-22 NOTE — Telephone Encounter (Signed)
Patient called to reschedule her breast excision for today. She was diagnosed with blood clots in her legs and will be seeing Dr Lucky Cowboy in Vascular today for this. She is currently on Coumadin. She will call once she is seen by vascular and give [place] an update on her treatment. Her excision has been rescheduled to 12/20/16 at 9:30 am.

## 2016-11-22 NOTE — Assessment & Plan Note (Signed)
On the right, this is likely chronic venous insufficiency after her injury many years ago. On the left, I suspect the Bakers cyst had more to do with a DVT as the Baker cyst was quite large. Recommend compression stockings, leg elevation, and continued Coumadin.

## 2016-12-20 ENCOUNTER — Ambulatory Visit: Payer: Medicare HMO | Admitting: General Surgery

## 2016-12-21 ENCOUNTER — Encounter (INDEPENDENT_AMBULATORY_CARE_PROVIDER_SITE_OTHER): Payer: Self-pay | Admitting: Vascular Surgery

## 2016-12-21 ENCOUNTER — Ambulatory Visit (INDEPENDENT_AMBULATORY_CARE_PROVIDER_SITE_OTHER): Payer: Medicare HMO | Admitting: Vascular Surgery

## 2016-12-21 ENCOUNTER — Ambulatory Visit (INDEPENDENT_AMBULATORY_CARE_PROVIDER_SITE_OTHER): Payer: Medicare HMO

## 2016-12-21 VITALS — BP 141/85 | HR 69 | Resp 16 | Ht 67.0 in | Wt 187.8 lb

## 2016-12-21 DIAGNOSIS — M7989 Other specified soft tissue disorders: Secondary | ICD-10-CM | POA: Diagnosis not present

## 2016-12-21 DIAGNOSIS — I82442 Acute embolism and thrombosis of left tibial vein: Secondary | ICD-10-CM

## 2016-12-21 DIAGNOSIS — E785 Hyperlipidemia, unspecified: Secondary | ICD-10-CM

## 2016-12-21 NOTE — Progress Notes (Signed)
Subjective:    Patient ID: Kelli Gonzalez, female    DOB: 04/06/1945, 71 y.o.   MRN: [id number] Chief Complaint  Patient presents with  . Follow-up    3wk le ven dvt   Patient presents today to review vascular studies. Patient was last seen on 11/22/2016. Patient presents without complaint today. She denies any swelling to her lower extremity. Denies any pain. Denies any shortness of breath or chest pain.Denies any fever, nausea or vomiting. The patient underwent a left lower extremity venous duplex exam which was notable for normal venous flow characteristics. All deep veins were assessed and are patent without any evidence of any deep vein thrombosis.   Review of Systems  Constitutional: Negative.   HENT: Negative.   Eyes: Negative.   Respiratory: Negative.   Cardiovascular: Negative.   Gastrointestinal: Negative.   Endocrine: Negative.   Genitourinary: Negative.   Musculoskeletal: Negative.   Skin: Negative.   Allergic/Immunologic: Negative.   Neurological: Negative.   Hematological: Negative.   Psychiatric/Behavioral: Negative.       Objective:   Physical Exam  Constitutional: She is oriented to person, place, and time. She appears well-developed and well-nourished. No distress.  HENT:  Head: Normocephalic and atraumatic.  Eyes: Pupils are equal, round, and reactive to light. Conjunctivae are normal.  Neck: Normal range of motion.  Cardiovascular: Normal rate, regular rhythm, normal heart sounds and intact distal pulses.   Pulses:      Radial pulses are 2+ on the right side, and 2+ on the left side.       Dorsalis pedis pulses are 2+ on the right side, and 2+ on the left side.       Posterior tibial pulses are 2+ on the right side, and 2+ on the left side.  There is no pain to palpation bilaterally. There is no pain with dorsiflexion bilaterally.  Pulmonary/Chest: Effort normal.  Musculoskeletal: Normal range of motion. She exhibits no edema.  Neurological: She is  alert and oriented to person, place, and time.  Skin: Skin is warm and dry. She is not diaphoretic.  Psychiatric: She has a normal mood and affect. Her behavior is normal. Judgment and thought content normal.  Vitals reviewed.  BP (!) 141/85 (BP Location: Right Arm)   Pulse 69   Resp 16   Ht 5\' 7"  (1.702 m)   Wt 187 lb 12.8 oz (85.2 kg)   BMI 29.41 kg/m   Past Medical History:  Diagnosis Date  . Asthma   . Cancer (HCC)    skin ca  . Diffuse cystic mastopathy   . Lupus    Social History   Social History  . Marital status: Married    Spouse name: N/A  . Number of children: N/A  . Years of education: N/A   Occupational History  . Not on file.   Social History Main Topics  . Smoking status: Never Smoker  . Smokeless tobacco: Never Used  . Alcohol use No  . Drug use: No  . Sexual activity: Not on file   Other Topics Concern  . Not on file   Social History Narrative  . No narrative on file   Past Surgical History:  Procedure Laterality Date  . ABDOMINAL HYSTERECTOMY    . BREAST BIOPSY Left    neg  . BREAST CYST ASPIRATION Right    neg  . CHOLECYSTECTOMY  2006  . COLONOSCOPY  2013   Dr Niel Hummer   . FACIAL COSMETIC SURGERY    .  MITRAL VALVE REPAIR  2002  . POLYPECTOMY  2009   Family History  Problem Relation Age of Onset  . Cancer Mother 47       breast  . Breast cancer Mother   . Heart disease Father    Allergies  Allergen Reactions  . Latex Rash    blisters      Assessment & Plan:  Patient presents today to review vascular studies. Patient was last seen on 11/22/2016. Patient presents without complaint today. She denies any swelling to her lower extremity. Denies any pain. Denies any shortness of breath or chest pain.Denies any fever, nausea or vomiting. The patient underwent a left lower extremity venous duplex exam which was notable for normal venous flow characteristics. All deep veins were assessed and are patent without any evidence of any deep  vein thrombosis.  1. Deep vein thrombosis (DVT) of tibial vein of left lower extremity, unspecified chronicity (HCC) - Resolved Patient with improvement in her left lower extremity symptoms. Patient without DVT on duplex today. Patient to follow-up PRN  2. Swelling of limb - Resolved Patient's left lower extremity swelling has resolved As above  3. Hyperlipidemia, unspecified hyperlipidemia type - Stable Encouraged good control as its slows the progression of atherosclerotic disease  Current Outpatient Prescriptions on File Prior to Visit  Medication Sig Dispense Refill  . ALPRAZolam (XANAX) 0.5 MG tablet Take 0.5 mg by mouth at bedtime.    . Metoprolol Succinate (TOPROL XL PO) Take 100 mg by mouth daily.     . pravastatin (PRAVACHOL) 40 MG tablet Take 40 mg by mouth daily.    Marland Kitchen warfarin (COUMADIN) 5 MG tablet Take 5 mg by mouth daily at 6 PM.     No current facility-administered medications on file prior to visit.     There are no Patient Instructions on file for this visit. No Follow-up on file.   Merinda Victorino A Michaela Shankel, PA-C

## 2016-12-28 ENCOUNTER — Ambulatory Visit (INDEPENDENT_AMBULATORY_CARE_PROVIDER_SITE_OTHER): Payer: Medicare HMO | Admitting: General Surgery

## 2016-12-28 ENCOUNTER — Encounter: Payer: Self-pay | Admitting: General Surgery

## 2016-12-28 VITALS — BP 130/70 | HR 72 | Resp 12 | Ht 69.0 in | Wt 186.0 lb

## 2016-12-28 DIAGNOSIS — D213 Benign neoplasm of connective and other soft tissue of thorax: Secondary | ICD-10-CM

## 2016-12-28 DIAGNOSIS — L989 Disorder of the skin and subcutaneous tissue, unspecified: Secondary | ICD-10-CM

## 2016-12-28 NOTE — Progress Notes (Signed)
Patient ID: Kelli Gonzalez, female   DOB: 1945-10-21, 71 y.o.   MRN: [id number]  Chief Complaint  Patient presents with  . Procedure    HPI Kelli Gonzalez is a 71 y.o. female here today for a right breast skin excision that was evaluated in clinic on 11/15/16. Patient states yeasterday she skin lesion came off while she was rubbing it in the shower. It bled a lot initially and has since been well controlled.   HPI  Past Medical History:  Diagnosis Date  . Asthma   . Cancer (HCC)    skin ca  . Diffuse cystic mastopathy   . Lupus     Past Surgical History:  Procedure Laterality Date  . ABDOMINAL HYSTERECTOMY    . BREAST BIOPSY Left    neg  . BREAST CYST ASPIRATION Right    neg  . CHOLECYSTECTOMY  2006  . COLONOSCOPY  2013   Dr Niel Hummer   . FACIAL COSMETIC SURGERY    . MITRAL VALVE REPAIR  2002  . POLYPECTOMY  2009    Family History  Problem Relation Age of Onset  . Cancer Mother 52       breast  . Breast cancer Mother   . Heart disease Father     Social History Social History  Substance Use Topics  . Smoking status: Never Smoker  . Smokeless tobacco: Never Used  . Alcohol use No    Allergies  Allergen Reactions  . Latex Rash    blisters    Current Outpatient Prescriptions  Medication Sig Dispense Refill  . ALPRAZolam (XANAX) 0.5 MG tablet Take 0.5 mg by mouth at bedtime.    . Metoprolol Succinate (TOPROL XL PO) Take 100 mg by mouth daily.     . pravastatin (PRAVACHOL) 40 MG tablet Take 40 mg by mouth daily.    Marland Kitchen warfarin (COUMADIN) 5 MG tablet Take 5 mg by mouth daily at 6 PM.     No current facility-administered medications for this visit.     Review of Systems Review of Systems  Constitutional: Negative.   Respiratory: Negative.   Cardiovascular: Negative.     Blood pressure 130/70, pulse 72, resp. rate 12, height 5\' 9"  (1.753 m), weight 186 lb (84.4 kg).  Physical Exam Physical Exam  Constitutional: She is oriented to person, place, and  time. She appears well-developed and well-nourished.  Pulmonary/Chest:    Neurological: She is alert and oriented to person, place, and time.  Skin: Skin is warm and dry.  Psychiatric: She has a normal mood and affect.    Data Reviewed Prior notes reviewed  Assessment    Skin tag of right breast - skin lesion fell off while patient was in the shower. In office, the 3 mm remaining base was removed with patient's consent.   Procedure: skin tag removal of right breast 1 mL of lidocaine with epi using a TB syringe was instilled into the base of the skin tag.  Area was prepped with Chloroprep and sterile field was established.  The remaining base of the skin tag was excised with punch biopsy forcep and sent for pathology. Cautery was applied to control bleeding. Neosporin and band-aid applied Patient tolerated the procedure well and was counseled on wound care.    Plan    Change band-aid daily. Return to clinic if needed.    HPI, Physical Exam, Assessment and Plan have been scribed under the direction and in the presence of Kathreen Cosier, MD  Ples Specter, CMA  I have completed the exam and reviewed the above documentation for accuracy and completeness.  I agree with the above.  Museum/gallery conservator has been used and any errors in dictation or transcription are unintentional.  Rhylin Venters G. Evette Cristal, M.D., F.A.C.S.   Gerlene Burdock G 12/28/2016, 11:18 AM

## 2016-12-28 NOTE — Patient Instructions (Signed)
Change band-aid daily. Return to clinic if needed.

## 2017-01-02 ENCOUNTER — Telehealth: Payer: Self-pay | Admitting: *Deleted

## 2017-01-02 NOTE — Telephone Encounter (Signed)
Notified patient as instructed, patient pleased. Discussed follow-up appointments, patient agrees  

## 2017-01-02 NOTE — Telephone Encounter (Signed)
-----   Message from Christene Lye, MD sent at 01/02/2017  8:13 AM EDT ----- Please inform pt= path is benign

## 2017-02-02 ENCOUNTER — Other Ambulatory Visit: Payer: Self-pay

## 2017-02-02 DIAGNOSIS — Z1231 Encounter for screening mammogram for malignant neoplasm of breast: Secondary | ICD-10-CM

## 2017-02-18 HISTORY — PX: BREAST BIOPSY: SHX20

## 2017-03-28 ENCOUNTER — Ambulatory Visit
Admission: RE | Admit: 2017-03-28 | Discharge: 2017-03-28 | Disposition: A | Discharge status: Home / Self Care | Payer: Medicare HMO | Source: Ambulatory Visit | Attending: General Surgery | Admitting: General Surgery

## 2017-03-28 ENCOUNTER — Other Ambulatory Visit: Payer: Self-pay | Admitting: General Surgery

## 2017-03-28 DIAGNOSIS — Z1231 Encounter for screening mammogram for malignant neoplasm of breast: Secondary | ICD-10-CM | POA: Insufficient documentation

## 2017-04-05 ENCOUNTER — Ambulatory Visit: Payer: Medicare HMO | Admitting: General Surgery

## 2017-04-05 ENCOUNTER — Encounter: Payer: Self-pay | Admitting: General Surgery

## 2017-04-05 VITALS — BP 128/74 | HR 76 | Resp 12 | Ht 69.0 in | Wt 192.0 lb

## 2017-04-05 DIAGNOSIS — N6019 Diffuse cystic mastopathy of unspecified breast: Secondary | ICD-10-CM | POA: Diagnosis not present

## 2017-04-05 NOTE — Progress Notes (Signed)
Patient ID: Kelli Gonzalez, female   DOB: 1945/09/19, 72 y.o.   MRN: [id number]  Chief Complaint  Patient presents with  . Follow-up    HPI Kelli Gonzalez is a 72 y.o. female who presents for a breast evaluation. The most recent mammogram was done on 03/28/2017.  Patient does perform regular self breast checks and gets regular mammograms done.   She farms all kinds of rescue animals.   HPI  Past Medical History:  Diagnosis Date  . Asthma   . Cancer ([place])    skin ca  . Diffuse cystic mastopathy   . Lupus     Past Surgical History:  Procedure Laterality Date  . ABDOMINAL HYSTERECTOMY    . BREAST BIOPSY Left    neg  . BREAST BIOPSY Right 02/2017   neg  . BREAST CYST ASPIRATION Right    neg  . CHOLECYSTECTOMY  2006  . COLONOSCOPY  2013   Dr Dionne Milo   . FACIAL COSMETIC SURGERY    . MITRAL VALVE REPAIR  2002  . POLYPECTOMY  2009    Family History  Problem Relation Age of Onset  . Cancer Mother 34       breast  . Breast cancer Mother   . Heart disease Father     Social History Social History   Tobacco Use  . Smoking status: Never Smoker  . Smokeless tobacco: Never Used  Substance Use Topics  . Alcohol use: No  . Drug use: No    Allergies  Allergen Reactions  . Latex Rash    blisters    Current Outpatient Medications  Medication Sig Dispense Refill  . ALPRAZolam (XANAX) 0.5 MG tablet Take 0.5 mg by mouth at bedtime.    . Metoprolol Succinate (TOPROL XL PO) Take 100 mg by mouth daily.     . pravastatin (PRAVACHOL) 40 MG tablet Take 40 mg by mouth daily.    Marland Kitchen warfarin (COUMADIN) 5 MG tablet Take 5 mg by mouth daily at 6 PM.     No current facility-administered medications for this visit.     Review of Systems Review of Systems  Constitutional: Negative.   Respiratory: Negative.   Cardiovascular: Negative.     Blood pressure 128/74, pulse 76, resp. rate 12, height 5\' 9"  (1.753 m), weight 192 lb (87.1 kg).  Physical Exam Physical Exam   Constitutional: She is oriented to person, place, and time. She appears well-developed and well-nourished.  Eyes: Conjunctivae are normal. No scleral icterus.  Neck: Neck supple.  Cardiovascular: Normal rate, regular rhythm and normal heart sounds.  Pulmonary/Chest: Effort normal and breath sounds normal. Right breast exhibits no inverted nipple, no mass, no nipple discharge, no skin change and no tenderness. Left breast exhibits no inverted nipple, no mass, no nipple discharge, no skin change and no tenderness.  Diffuse coarse granular texture throughout both breasts.  No dominant mass.  Site of prior seborrheic keratosis on the right nipple/areola well-healed.  Lymphadenopathy:    She has no cervical adenopathy.    She has no axillary adenopathy.  Neurological: She is alert and oriented to person, place, and time.  Skin: Skin is warm and dry.    Data Reviewed March 28, 2017 screening mammogram reviewed.  No interval change.  BI-RADS-1.  Assessment    Benign breast exam.    Plan        Patient will be asked to return to the office in one year with a bilateral screening mammogram. The patient  is aware to call back for any questions or concerns.   HPI, Physical Exam, Assessment and Plan have been scribed under the direction and in the presence of Kelli Ard, MD.  Kelli Gonzalez, CMA  I have completed the exam and reviewed the above documentation for accuracy and completeness.  I agree with the above.  Haematologist has been used and any errors in dictation or transcription are unintentional.  Kelli Gonzalez, M.D., F.A.C.S.  Kelli Gonzalez 04/05/2017, 7:18 PM

## 2017-04-05 NOTE — Patient Instructions (Signed)
Patient will be asked to return to the office in one year with a bilateral screening mammogram. 

## 2017-08-07 ENCOUNTER — Ambulatory Visit
Admission: RE | Admit: 2017-08-07 | Discharge: 2017-08-07 | Disposition: A | Discharge status: Home / Self Care | Payer: Medicare HMO | Source: Ambulatory Visit | Attending: Family Medicine | Admitting: Family Medicine

## 2017-08-07 ENCOUNTER — Other Ambulatory Visit: Payer: Self-pay | Admitting: Family Medicine

## 2017-08-07 DIAGNOSIS — S60222A Contusion of left hand, initial encounter: Secondary | ICD-10-CM

## 2017-08-07 DIAGNOSIS — X58XXXA Exposure to other specified factors, initial encounter: Secondary | ICD-10-CM | POA: Insufficient documentation

## 2017-08-07 DIAGNOSIS — M19032 Primary osteoarthritis, left wrist: Secondary | ICD-10-CM | POA: Insufficient documentation

## 2017-10-03 ENCOUNTER — Encounter (INDEPENDENT_AMBULATORY_CARE_PROVIDER_SITE_OTHER): Payer: Self-pay | Admitting: Vascular Surgery

## 2017-10-03 ENCOUNTER — Ambulatory Visit (INDEPENDENT_AMBULATORY_CARE_PROVIDER_SITE_OTHER): Payer: Medicare HMO | Admitting: Vascular Surgery

## 2017-10-03 VITALS — BP 124/69 | HR 61 | Resp 14 | Ht 65.0 in | Wt 185.0 lb

## 2017-10-03 DIAGNOSIS — M79605 Pain in left leg: Secondary | ICD-10-CM

## 2017-10-03 DIAGNOSIS — M79604 Pain in right leg: Secondary | ICD-10-CM | POA: Diagnosis not present

## 2017-10-03 DIAGNOSIS — I83813 Varicose veins of bilateral lower extremities with pain: Secondary | ICD-10-CM | POA: Diagnosis not present

## 2017-10-03 NOTE — Progress Notes (Signed)
Subjective:    Patient ID: Kelli Gonzalez, female    DOB: [birth year], 72 y.o.   MRN: [id number] Chief Complaint  Patient presents with  . Follow-up    Leg pain   Patient last seen on December 21, 2016.  During her last visit, the patient's DVT was found to be resolved.  The patient continues to be on Coumadin for a cardiac valve replacement completed approximately 20 years ago.  The patient presents today with approximately 6 weeks of worsening bilateral "leg cramping".  The patient notes she experiences cramping which starts in her bilateral knees and radiate upwards towards her hips.  The patient also notes that her varicosities noted to the bilateral lower extremity have increased in size.  The patient states she experiences this "cramping" both with and without activity.  The patient denies any rest pain or ulcer formation to the bilateral lower extremity.  Patient denies any shortness of breath or chest pain.  Patient denies any recent surgery or trauma to the lower legs.  Patient denies any fever, nausea vomiting.  Review of Systems  Constitutional: Negative.   HENT: Negative.   Eyes: Negative.   Respiratory: Negative.   Cardiovascular:       Bilateral lower extremity cramping Painful varicose veins  Gastrointestinal: Negative.   Endocrine: Negative.   Genitourinary: Negative.   Musculoskeletal: Negative.   Skin: Negative.   Allergic/Immunologic: Negative.   Neurological: Negative.   Hematological: Negative.   Psychiatric/Behavioral: Negative.       Objective:   Physical Exam  Constitutional: She is oriented to person, place, and time. She appears well-developed and well-nourished. No distress.  HENT:  Head: Normocephalic and atraumatic.  Right Ear: External ear normal.  Left Ear: External ear normal.  Eyes: Pupils are equal, round, and reactive to light. Conjunctivae and EOM are normal.  Neck: Normal range of motion.  Cardiovascular: Normal rate, regular rhythm, normal  heart sounds and intact distal pulses.  Pulses:      Radial pulses are 2+ on the right side, and 2+ on the left side.       Dorsalis pedis pulses are 1+ on the right side, and 1+ on the left side.       Posterior tibial pulses are 2+ on the right side, and 2+ on the left side.  Pulmonary/Chest: Effort normal and breath sounds normal.  Musculoskeletal: Normal range of motion. She exhibits no edema.  Neurological: She is alert and oriented to person, place, and time.  Skin: Skin is warm and dry. She is not diaphoretic.  Psychiatric: She has a normal mood and affect. Her behavior is normal. Judgment and thought content normal.  Vitals reviewed.  BP 124/69 (BP Location: Right Arm, Patient Position: Sitting)   Pulse 61   Resp 14   Ht 5\' 5"  (1.651 m)   Wt 185 lb (83.9 kg)   BMI 30.79 kg/m   Past Medical History:  Diagnosis Date  . Asthma   . Cancer (HCC)    skin ca  . Diffuse cystic mastopathy   . Lupus (HCC)    Social History   Socioeconomic History  . Marital status: Married    Spouse name: Not on file  . Number of children: Not on file  . Years of education: Not on file  . Highest education level: Not on file  Occupational History  . Not on file  Social Needs  . Financial resource strain: Not on file  . Food insecurity:  Worry: Not on file    Inability: Not on file  . Transportation needs:    Medical: Not on file    Non-medical: Not on file  Tobacco Use  . Smoking status: Never Smoker  . Smokeless tobacco: Never Used  Substance and Sexual Activity  . Alcohol use: No  . Drug use: No  . Sexual activity: Not on file  Lifestyle  . Physical activity:    Days per week: Not on file    Minutes per session: Not on file  . Stress: Not on file  Relationships  . Social connections:    Talks on phone: Not on file    Gets together: Not on file    Attends religious service: Not on file    Active member of club or organization: Not on file    Attends meetings of clubs  or organizations: Not on file    Relationship status: Not on file  . Intimate partner violence:    Fear of current or ex partner: Not on file    Emotionally abused: Not on file    Physically abused: Not on file    Forced sexual activity: Not on file  Other Topics Concern  . Not on file  Social History Narrative  . Not on file   Past Surgical History:  Procedure Laterality Date  . ABDOMINAL HYSTERECTOMY    . BREAST BIOPSY Left    neg  . BREAST BIOPSY Right 02/2017   neg  . BREAST CYST ASPIRATION Right    neg  . CHOLECYSTECTOMY  2006  . COLONOSCOPY  2013   Dr Niel Hummer   . FACIAL COSMETIC SURGERY    . MITRAL VALVE REPAIR  2002  . POLYPECTOMY  2009   Family History  Problem Relation Age of Onset  . Cancer Mother 82       breast  . Breast cancer Mother   . Heart disease Father    Allergies  Allergen Reactions  . Latex Rash    blisters  . Morphine Itching      Assessment & Plan:  Patient last seen on December 21, 2016.  During her last visit, the patient's DVT was found to be resolved.  The patient continues to be on Coumadin for a cardiac valve replacement completed approximately 20 years ago.  The patient presents today with approximately 6 weeks of worsening bilateral "leg cramping".  The patient notes she experiences cramping which starts in her bilateral knees and radiate upwards towards her hips.  The patient also notes that her varicosities noted to the bilateral lower extremity have increased in size.  The patient states she experiences this "cramping" both with and without activity.  The patient denies any rest pain or ulcer formation to the bilateral lower extremity.  Patient denies any shortness of breath or chest pain.  Patient denies any recent surgery or trauma to the lower legs.  Patient denies any fever, nausea vomiting.  1. Lower extremity pain, bilateral - New Patient does have a past medical history of lupus and states that she has been experiencing  progressively worsening left hip pain. Recommend that the patient also follow-up with her primary care physician to rule out any osteoarthritis or degenerative joint disease that may be contributing to her bilateral lower extremity discomfort I will bring the patient back and have her undergo a bilateral ABI to rule out any contributing peripheral artery disease  - VAS [place] ABI WITH/WO TBI; Future  2. Varicose veins of bilateral  lower extremities with pain - New The patient was encouraged to wear graduated compression stockings (20-30 mmHg) on a daily basis. The patient was instructed to begin wearing the stockings first thing in the morning and removing them in the evening. The patient was instructed specifically not to sleep in the stockings. Prescription given. In addition, behavioral modification including elevation during the day will be initiated. I will bring the patient back and have her undergo bilateral lower extremity venous duplex to rule out any contributing venous versus lymphatic disease.  The patient will follow up in three months to asses conservative management.  Information on chronic venous insufficiency and compression stockings was given to the patient. The patient was instructed to call the office in the interim if any worsening edema or ulcerations to the legs, feet or toes occurs. The patient expresses their understanding.  - VAS [place] LOWER EXTREMITY VENOUS REFLUX; Future  Current Outpatient Medications on File Prior to Visit  Medication Sig Dispense Refill  . ALPRAZolam (XANAX) 0.5 MG tablet Take 0.5 mg by mouth at bedtime.    Marland Kitchen lisinopril (PRINIVIL,ZESTRIL) 10 MG tablet   3  . Metoprolol Succinate (TOPROL XL PO) Take 100 mg by mouth daily.     . pravastatin (PRAVACHOL) 40 MG tablet Take 40 mg by mouth daily.    Marland Kitchen warfarin (COUMADIN) 5 MG tablet Take 5 mg by mouth daily at 6 PM.     No current facility-administered medications on file prior to visit.     There are no  Patient Instructions on file for this visit. No follow-ups on file.   Kurtiss Wence A Bertine Schlottman, PA-C

## 2018-02-13 ENCOUNTER — Other Ambulatory Visit: Payer: Self-pay

## 2018-02-13 DIAGNOSIS — Z1231 Encounter for screening mammogram for malignant neoplasm of breast: Secondary | ICD-10-CM

## 2018-02-27 ENCOUNTER — Encounter (INDEPENDENT_AMBULATORY_CARE_PROVIDER_SITE_OTHER): Payer: Medicare HMO

## 2018-02-27 ENCOUNTER — Ambulatory Visit (INDEPENDENT_AMBULATORY_CARE_PROVIDER_SITE_OTHER): Payer: Medicare HMO | Admitting: Vascular Surgery

## 2018-03-29 ENCOUNTER — Other Ambulatory Visit: Payer: Self-pay | Admitting: Surgery

## 2018-03-29 ENCOUNTER — Encounter (INDEPENDENT_AMBULATORY_CARE_PROVIDER_SITE_OTHER): Payer: Self-pay

## 2018-03-29 ENCOUNTER — Ambulatory Visit
Admission: RE | Admit: 2018-03-29 | Discharge: 2018-03-29 | Disposition: A | Discharge status: Home / Self Care | Payer: Medicare HMO | Source: Ambulatory Visit | Attending: General Surgery | Admitting: General Surgery

## 2018-03-29 DIAGNOSIS — R928 Other abnormal and inconclusive findings on diagnostic imaging of breast: Secondary | ICD-10-CM

## 2018-03-29 DIAGNOSIS — Z1231 Encounter for screening mammogram for malignant neoplasm of breast: Secondary | ICD-10-CM | POA: Diagnosis present

## 2018-03-29 DIAGNOSIS — N6320 Unspecified lump in the left breast, unspecified quadrant: Secondary | ICD-10-CM

## 2018-04-03 ENCOUNTER — Ambulatory Visit: Payer: Medicare HMO | Admitting: General Surgery

## 2018-04-04 ENCOUNTER — Ambulatory Visit
Admission: RE | Admit: 2018-04-04 | Discharge: 2018-04-04 | Disposition: A | Discharge status: Home / Self Care | Payer: Medicare HMO | Source: Ambulatory Visit | Attending: Surgery | Admitting: Surgery

## 2018-04-04 DIAGNOSIS — N6320 Unspecified lump in the left breast, unspecified quadrant: Secondary | ICD-10-CM | POA: Insufficient documentation

## 2018-04-04 DIAGNOSIS — R928 Other abnormal and inconclusive findings on diagnostic imaging of breast: Secondary | ICD-10-CM | POA: Insufficient documentation

## 2018-04-06 ENCOUNTER — Encounter (INDEPENDENT_AMBULATORY_CARE_PROVIDER_SITE_OTHER): Payer: Self-pay | Admitting: Nurse Practitioner

## 2018-04-06 ENCOUNTER — Ambulatory Visit (INDEPENDENT_AMBULATORY_CARE_PROVIDER_SITE_OTHER): Payer: Medicare HMO

## 2018-04-06 ENCOUNTER — Ambulatory Visit (INDEPENDENT_AMBULATORY_CARE_PROVIDER_SITE_OTHER): Payer: Medicare HMO | Admitting: Nurse Practitioner

## 2018-04-06 VITALS — BP 166/82 | HR 58 | Resp 16 | Ht 67.0 in | Wt 195.6 lb

## 2018-04-06 DIAGNOSIS — I83813 Varicose veins of bilateral lower extremities with pain: Secondary | ICD-10-CM

## 2018-04-06 DIAGNOSIS — M79604 Pain in right leg: Secondary | ICD-10-CM

## 2018-04-06 DIAGNOSIS — M79605 Pain in left leg: Secondary | ICD-10-CM

## 2018-04-06 DIAGNOSIS — I890 Lymphedema, not elsewhere classified: Secondary | ICD-10-CM

## 2018-04-13 ENCOUNTER — Encounter (INDEPENDENT_AMBULATORY_CARE_PROVIDER_SITE_OTHER): Payer: Self-pay | Admitting: Nurse Practitioner

## 2018-04-13 DIAGNOSIS — I890 Lymphedema, not elsewhere classified: Secondary | ICD-10-CM | POA: Insufficient documentation

## 2018-04-13 NOTE — Progress Notes (Signed)
Subjective:    Patient ID: Kelli Gonzalez, female    DOB: 1945/12/21, 73 y.o.   MRN: [id number] Chief Complaint  Patient presents with  . Follow-up    HPI  Kelli Gonzalez is a 73 y.o. female is following up today with noninvasive studies to evaluate her lower extremity pain.  She reports having cramps in her medial thigh areas.  It mostly happens when she is waking in the morning.  She also endorses having some radiating pain going down her legs as well.  She reports that her knee pain that she previously experiences not as bad and longer.  She states that taking aspirin sometimes does help with the pain.  She denies any fever, chills, nausea, vomiting or diarrhea.  The patient states having surgery to her bilateral legs many years ago which sounds as if it may have been an endovenous laser ablation.  She endorses undergoing sclerotherapy at that time as well.  However since then her varicose veins have begun to swell more and give her great discomfort, despite utilizing medical grade 1 compression therapy.  She also endorses elevating her legs whenever possible as well as exercise.  Patient underwent bilateral ABIs which revealed an ABI 1.21 on her right lower extremity with an ABI 1.20 on her left.  She had strong triphasic tibial artery waveforms bilaterally.  She also underwent a venous reflux study which revealed reflux in the right lower extremity in the common femoral vein, saphenofemoral junction, femoral vein and accessory saphenous vein.  The left lower extremity has reflux present also in the common femoral vein, saphenofemoral junction, and accessory saphenous vein.    Past Medical History:  Diagnosis Date  . Asthma   . Cancer ([place])    skin ca  . Diffuse cystic mastopathy   . Lupus [facility])     Past Surgical History:  Procedure Laterality Date  . ABDOMINAL HYSTERECTOMY    . BREAST BIOPSY Left    neg  . BREAST BIOPSY Right 02/2017   neg  . BREAST CYST ASPIRATION Right      neg  . CHOLECYSTECTOMY  2006  . COLONOSCOPY  2013   Dr Dionne Milo   . FACIAL COSMETIC SURGERY    . MITRAL VALVE REPAIR  2002  . POLYPECTOMY  2009    Social History   Socioeconomic History  . Marital status: Married    Spouse name: Not on file  . Number of children: Not on file  . Years of education: Not on file  . Highest education level: Not on file  Occupational History  . Not on file  Social Needs  . Financial resource strain: Not on file  . Food insecurity:    Worry: Not on file    Inability: Not on file  . Transportation needs:    Medical: Not on file    Non-medical: Not on file  Tobacco Use  . Smoking status: Never Smoker  . Smokeless tobacco: Never Used  Substance and Sexual Activity  . Alcohol use: No  . Drug use: No  . Sexual activity: Not on file  Lifestyle  . Physical activity:    Days per week: Not on file    Minutes per session: Not on file  . Stress: Not on file  Relationships  . Social connections:    Talks on phone: Not on file    Gets together: Not on file    Attends religious service: Not on file    Active member  of club or organization: Not on file    Attends meetings of clubs or organizations: Not on file    Relationship status: Not on file  . Intimate partner violence:    Fear of current or ex partner: Not on file    Emotionally abused: Not on file    Physically abused: Not on file    Forced sexual activity: Not on file  Other Topics Concern  . Not on file  Social History Narrative  . Not on file    Family History  Problem Relation Age of Onset  . Cancer Mother 16       breast  . Breast cancer Mother   . Heart disease Father     Allergies  Allergen Reactions  . Latex Rash    blisters  . Morphine Itching     Review of Systems   Review of Systems: Negative Unless Checked Constitutional: [] Weight loss  [] Fever  [] Chills Cardiac: [] Chest pain   []  Atrial Fibrillation  [] Palpitations   [] Shortness of breath when laying  flat   [] Shortness of breath with exertion. 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   [] Phlebitis   [x] Swelling in legs   [x] Varicose veins   [] Non-healing ulcers Pulmonary:   [] Uses home oxygen   [] Productive cough   [] Hemoptysis   [] Wheeze  [] COPD   [] Asthma Neurologic:  [] Dizziness   [] Seizures  [x] Blackouts [] History of stroke   [] History of TIA  [] Aphasia   [] Temporary Blindness   [] Weakness or numbness in arm   [] Weakness or numbness in leg Musculoskeletal:   [] Joint swelling   [] Joint pain   [] Low back pain  []  History of Knee Replacement [] Arthritis [] back Surgeries  []  Spinal Stenosis    Hematologic:  [] Easy bruising  [] Easy bleeding   [] Hypercoagulable state   [] Anemic Gastrointestinal:  [] Diarrhea   [] Vomiting  [] Gastroesophageal reflux/heartburn   [] Difficulty swallowing. [] Abdominal pain Genitourinary:  [] Chronic kidney disease   [] Difficult urination  [] Anuric   [] Blood in urine [] Frequent urination  [] Burning with urination   [] Hematuria Skin:  [] Rashes   [] Ulcers [] Wounds Psychological:  [] History of anxiety   []  History of major depression  []  Memory Difficulties     Objective:   Physical Exam  BP (!) 166/82 (BP Location: Right Arm, Patient Position: Sitting)   Pulse (!) 58   Resp 16   Ht 5\' 7"  (1.702 m)   Wt 195 lb 9.6 oz (88.7 kg)   BMI 30.64 kg/m   Gen: WD/WN, NAD Head: [place]/AT, No temporalis wasting.  Ear/Nose/Throat: Hearing grossly intact, nares w/o erythema or drainage Eyes: PER, EOMI, sclera nonicteric.  Neck: Supple, no masses.  No JVD.  Pulmonary:  Good air movement, no use of accessory muscles.  Cardiac: RRR Vascular: 2+ edema bilaterally  Vessel Right Left  Radial Palpable Palpable  Dorsalis Pedis Palpable Palpable  Posterior Tibial Palpable Palpable   Gastrointestinal: soft, non-distended.  No guarding/no peritoneal signs.  Musculoskeletal: M/S 5/5 throughout.  No deformity or atrophy.  Neurologic: Pain and light touch intact in extremities.  Symmetrical.  Speech is fluent. Motor exam as listed above. Psychiatric: Judgment intact, Mood & affect appropriate for pt's clinical situation. Dermatologic: bilateral stasis dermatitis. No Ulcers Noted.  No changes consistent with cellulitis. Lymph : No Cervical lymphadenopathy,  no lichenification or skin changes of chronic lymphedema.      Assessment & Plan:   1. Lower extremity pain, bilateral Recommend:  I do not find evidence of Vascular pathology that would explain the patient's symptoms  The patient has atypical pain symptoms for vascular disease  Noninvasive studies including venous ultrasound of the legs do not identify vascular problems  The patient should continue walking and begin a more formal exercise program. The patient should continue his antiplatelet therapy and aggressive treatment of the lipid abnormalities. The patient should begin wearing graduated compression socks 15-20 mmHg strength to control her mild edema.  Patient will follow-up with me on a PRN basis  Further work-up of her lower extremity pain is deferred to the primary service     2. Lymphedema No surgery or intervention at this point in time.  I have reviewed my discussion with the patient regarding venous insufficiency and why it causes symptoms. I have discussed with the patient the chronic skin changes that accompany venous insufficiency and the long term sequela such as ulceration. Patient will contnue wearing graduated compression stockings on a daily basis, as this has provided excellent control of his edema. The patient will put the stockings on first thing in the morning and removing them in the evening. The patient is reminded not to sleep in the stockings.  In addition, behavioral modification including elevation during the day will be  initiated. Exercise is strongly encouraged.  Given the patient's good control and lack of any problems regarding the venous insufficiency and lymphedema a lymph pump in not need at this time.  The patient will follow up with me PRN should anything change.  The patient voices agreement with this plan.   3. Varicose veins of bilateral lower extremities with pain Recommend:  The patient is complaining of varicose veins.    I have had a long discussion with the patient regarding  varicose veins and why they cause symptoms.  Patient will begin wearing graduated compression stockings on a daily basis, beginning first thing in the morning and removing them in the evening. The patient is instructed specifically not to sleep in the stockings.    The patient  will also begin using over-the-counter analgesics such as Motrin 600 mg po TID to help control the symptoms as needed.    In addition, behavioral modification including elevation during the day will be initiated, utilizing a recliner was recommended.  The patient is also instructed to continue exercising such as walking 4-5 times per week.  At this time the patient wishes to continue conservative therapy and is not interested in more invasive treatments such as laser ablation and sclerotherapy.  The Patient will follow up PRN if the symptoms worsen.    Current Outpatient Medications on File Prior to Visit  Medication Sig Dispense Refill  . ALPRAZolam (XANAX) 0.5 MG tablet Take 0.5 mg by mouth at bedtime.    . furosemide (LASIX) 40 MG tablet Take 40 mg by mouth daily.    Marland Kitchen lisinopril (PRINIVIL,ZESTRIL) 10 MG tablet Take 10 mg by mouth daily.   3  . Metoprolol Succinate (TOPROL XL PO) Take 100 mg by mouth daily.     . pravastatin (PRAVACHOL) 40 MG tablet Take 40 mg by mouth daily.    Marland Kitchen warfarin (COUMADIN) 5 MG tablet Take 5 mg by mouth daily at 6 PM.    . predniSONE (DELTASONE) 10 MG tablet Take 10 mg by mouth as needed.  No current  facility-administered medications on file prior to visit.     There are no Patient Instructions on file for this visit. Return if symptoms worsen or fail to improve.   Kris Hartmann, NP  This note was completed with Sales executive.  Any errors are purely unintentional.

## 2018-05-01 ENCOUNTER — Other Ambulatory Visit: Payer: Self-pay

## 2018-05-01 ENCOUNTER — Encounter: Payer: Self-pay | Admitting: General Surgery

## 2018-05-01 ENCOUNTER — Ambulatory Visit: Payer: Medicare HMO | Admitting: General Surgery

## 2018-05-01 VITALS — BP 130/78 | HR 64 | Temp 97.5°F | Resp 16 | Ht 67.0 in | Wt 197.6 lb

## 2018-05-01 DIAGNOSIS — Z1231 Encounter for screening mammogram for malignant neoplasm of breast: Secondary | ICD-10-CM

## 2018-05-01 NOTE — Patient Instructions (Addendum)
The patient is aware to call back for any questions or new concerns. Patient will be asked to return to the office in one year with a bilateral screening mammogram.  

## 2018-05-01 NOTE — Progress Notes (Signed)
Patient ID: Kelli Gonzalez, female   DOB: 1946-02-03, 73 y.o.   MRN: [id number]  Chief Complaint  Patient presents with  . Follow-up    1 year mammogram    HPI Kelli Gonzalez is a 73 y.o. female who presents for a breast evaluation. The most recent mammogram was done on 03/29/2018. Left breast ultrasound was 04-04-18. Patient does perform regular self breast checks and gets regular mammograms done. Bilateral soreness in breast. She does monthly breast exams.    HPI  Past Medical History:  Diagnosis Date  . Asthma   . Cancer ([facility])    skin ca  . Diffuse cystic mastopathy   . Lupus [facility])     Past Surgical History:  Procedure Laterality Date  . ABDOMINAL HYSTERECTOMY    . BREAST BIOPSY Left    neg  . BREAST BIOPSY Right 02/2017   neg  . BREAST CYST ASPIRATION Right    neg  . CHOLECYSTECTOMY  2006  . COLONOSCOPY  2013   Dr Niel Hummer   . FACIAL COSMETIC SURGERY    . MITRAL VALVE REPAIR  [address] [place]  . POLYPECTOMY  2009    Family History  Problem Relation Age of Onset  . Cancer Mother 27       breast  . Breast cancer Mother   . Heart disease Father     Social History Social History   Tobacco Use  . Smoking status: Never Smoker  . Smokeless tobacco: Never Used  Substance Use Topics  . Alcohol use: No  . Drug use: No    Allergies  Allergen Reactions  . Latex Rash    blisters  . Morphine Itching    Current Outpatient Medications  Medication Sig Dispense Refill  . ALPRAZolam (XANAX) 0.5 MG tablet Take 0.5 mg by mouth at bedtime.    . furosemide (LASIX) 40 MG tablet Take 40 mg by mouth daily.    Marland Kitchen lisinopril (PRINIVIL,ZESTRIL) 10 MG tablet Take 10 mg by mouth daily.   3  . Metoprolol Succinate (TOPROL XL PO) Take 100 mg by mouth daily.     . pravastatin (PRAVACHOL) 40 MG tablet Take 40 mg by mouth daily.    . predniSONE (DELTASONE) 10 MG tablet Take 10 mg by mouth as needed.    . warfarin (COUMADIN) 5 MG tablet Take 5 mg by mouth daily at 6 PM.     No  current facility-administered medications for this visit.     Review of Systems Review of Systems  Constitutional: Negative.   Respiratory: Negative.   Cardiovascular: Negative.     Blood pressure 130/78, pulse 64, temperature (!) 97.5 F (36.4 C), temperature source Temporal, resp. rate 16, height 5\' 7"  (1.702 m), weight 197 lb 9.6 oz (89.6 kg), SpO2 94 %.  Physical Exam Physical Exam Constitutional:      Appearance: She is well-developed.  Eyes:     General: No scleral icterus.    Conjunctiva/sclera: Conjunctivae normal.  Neck:     Musculoskeletal: Neck supple.  Cardiovascular:     Rate and Rhythm: Normal rate and regular rhythm.     Heart sounds: Normal heart sounds.  Pulmonary:     Effort: Pulmonary effort is normal.     Breath sounds: Normal breath sounds.  Chest:     Breasts:        Right: No inverted nipple, mass, nipple discharge, skin change or tenderness.        Left: No inverted  nipple, mass, nipple discharge, skin change or tenderness.  Lymphadenopathy:     Cervical: No cervical adenopathy.  Skin:    General: Skin is warm and dry.  Neurological:     Mental Status: She is alert and oriented to person, place, and time.  Psychiatric:        Behavior: Behavior normal.     Data Reviewed Screening mammograms dated March 29, 2018 suggesting a density in the right breast.  Subsequent ultrasound confirmed a simple cyst.  BI-RADS-2..  Assessment    Benign breast exam.    Plan  The patient is aware to call back for any questions or new concerns. Patient will be asked to return to the office in one year with a bilateral screening mammogram.   HPI, Physical Exam, Assessment and Plan have been scribed under the direction and in the presence of Donnalee Curry, MD.  Ples Specter, CMA HPI, assessment, plan and physical exam has been scribed under the direction and in the presence of Earline Mayotte, MD. Dorathy Daft, RN  I have completed the exam and  reviewed the above documentation for accuracy and completeness.  I agree with the above.  Museum/gallery conservator has been used and any errors in dictation or transcription are unintentional.  Donnalee Curry, M.D., F.A.C.S.  Kelli Gonzalez 05/02/2018, 6:35 PM

## 2018-05-02 ENCOUNTER — Encounter: Payer: Self-pay | Admitting: General Surgery

## 2018-10-15 ENCOUNTER — Encounter: Payer: Self-pay | Admitting: General Surgery

## 2019-01-10 ENCOUNTER — Other Ambulatory Visit: Payer: Self-pay

## 2019-01-10 ENCOUNTER — Telehealth: Payer: Self-pay | Admitting: General Practice

## 2019-01-10 DIAGNOSIS — N6310 Unspecified lump in the right breast, unspecified quadrant: Secondary | ICD-10-CM

## 2019-01-10 NOTE — Telephone Encounter (Signed)
Patient is calling said she feels a knot in her right breast. Patient said she was pain was at about an 8. Please call patient and advise she can be reached at [phone].

## 2019-01-10 NOTE — Telephone Encounter (Signed)
Patient states that 3 days ago she started feeling a painful knot in her right breast at the 3o'clk location. She states that this feels different from her one she had in her left breast. We will get her set up for imaging studies at [facility] to assess the area.  The patient is scheduled for this at [facility] on 01/17/19 at 11:00 am. She is aware of date and time.

## 2019-01-17 ENCOUNTER — Ambulatory Visit
Admission: RE | Admit: 2019-01-17 | Discharge: 2019-01-17 | Disposition: A | Discharge status: Home / Self Care | Payer: Medicare HMO | Source: Ambulatory Visit | Attending: Surgery | Admitting: Surgery

## 2019-01-17 DIAGNOSIS — N644 Mastodynia: Secondary | ICD-10-CM | POA: Diagnosis not present

## 2019-01-17 DIAGNOSIS — N6311 Unspecified lump in the right breast, upper outer quadrant: Secondary | ICD-10-CM | POA: Diagnosis not present

## 2019-01-17 DIAGNOSIS — N6310 Unspecified lump in the right breast, unspecified quadrant: Secondary | ICD-10-CM

## 2019-02-25 ENCOUNTER — Other Ambulatory Visit: Payer: Self-pay

## 2019-02-25 DIAGNOSIS — Z1231 Encounter for screening mammogram for malignant neoplasm of breast: Secondary | ICD-10-CM

## 2019-02-25 DIAGNOSIS — N6310 Unspecified lump in the right breast, unspecified quadrant: Secondary | ICD-10-CM

## 2019-04-01 ENCOUNTER — Ambulatory Visit
Admission: RE | Admit: 2019-04-01 | Discharge: 2019-04-01 | Disposition: A | Discharge status: Home / Self Care | Payer: Medicare HMO | Source: Ambulatory Visit | Attending: General Surgery | Admitting: General Surgery

## 2019-04-01 ENCOUNTER — Other Ambulatory Visit: Payer: Self-pay

## 2019-04-01 ENCOUNTER — Other Ambulatory Visit: Payer: Self-pay | Admitting: General Surgery

## 2019-04-01 DIAGNOSIS — Z1231 Encounter for screening mammogram for malignant neoplasm of breast: Secondary | ICD-10-CM

## 2019-04-08 ENCOUNTER — Ambulatory Visit: Payer: Medicare HMO | Admitting: Surgery

## 2019-04-08 ENCOUNTER — Ambulatory Visit: Payer: Medicare HMO

## 2020-02-25 ENCOUNTER — Other Ambulatory Visit: Payer: Self-pay | Admitting: General Surgery

## 2020-02-25 DIAGNOSIS — N6019 Diffuse cystic mastopathy of unspecified breast: Secondary | ICD-10-CM

## 2020-04-28 ENCOUNTER — Ambulatory Visit
Admission: EM | Admit: 2020-04-28 | Discharge: 2020-04-28 | Disposition: A | Discharge status: Home / Self Care | Payer: Medicare HMO | Attending: Sports Medicine | Admitting: Sports Medicine

## 2020-04-28 ENCOUNTER — Other Ambulatory Visit: Payer: Self-pay

## 2020-04-28 DIAGNOSIS — S61011A Laceration without foreign body of right thumb without damage to nail, initial encounter: Secondary | ICD-10-CM | POA: Diagnosis not present

## 2020-04-28 DIAGNOSIS — Z23 Encounter for immunization: Secondary | ICD-10-CM | POA: Diagnosis not present

## 2020-04-28 DIAGNOSIS — M79644 Pain in right finger(s): Secondary | ICD-10-CM

## 2020-04-28 DIAGNOSIS — S6991XA Unspecified injury of right wrist, hand and finger(s), initial encounter: Secondary | ICD-10-CM

## 2020-04-28 HISTORY — DX: Unspecified atrial fibrillation: I48.91

## 2020-04-28 MED ORDER — TETANUS-DIPHTH-ACELL PERTUSSIS 5-2.5-18.5 LF-MCG/0.5 IM SUSY
0.5000 mL | PREFILLED_SYRINGE | Freq: Once | INTRAMUSCULAR | Status: AC
  Administered 2020-04-28: 0.5 mL via INTRAMUSCULAR

## 2020-04-28 NOTE — Discharge Instructions (Addendum)
You have a laceration to your right thumb. You had 6 sutures placed. I needed to cross sutured them given the location. Also complicating your situation is that you are on Coumadin with a INR of 2.5-3.0. Please see attached wound care instructions. You also received a tetanus booster today. Please keep the wound clean and dry. We put a pressure bandage on it. You can change that every 48-72 hours. If you have any fever or redness around the wound please come back to the urgent care immediately. Or go to the emergency room. Otherwise come back in 10 to 14 days for suture removal.  I hope you get to feeling better, Dr. Drema Dallas

## 2020-04-28 NOTE — ED Triage Notes (Signed)
Patient states that she was cutting an apple with a mandolin and sliced her right thumb. Patient states that she is currently on coumadin for a valve replacement.Patient states that this happen around 30-40 mins ago and has been unable to stop the bleeding.

## 2020-04-28 NOTE — ED Provider Notes (Signed)
[facility]    CSN: [id number] Arrival date & time: 04/28/20  1549      History   Chief Complaint Chief Complaint  Patient presents with  . Laceration    HPI Kelli Gonzalez is a 75 y.o. female.   Patient pleasant 75 year old right-hand-dominant female who presents for evaluation of an injury to her right thumb.  It happened about 30-40 minutes prior to arrival.  Complicating her situation is that she is on Coumadin for atrial fibrillation and mechanical valve and her INR runs between 2.5 and 3.0.  Her last INR was 2.6.  She cutting slicing an apple with a mandolin at home.  She thinks she had her tetanus booster but we do not have any documentation to that.  Its been at least 5 years.  No other issues or problems are offered.     Past Medical History:  Diagnosis Date  . Asthma   . Atrial fibrillation ([place])   . Cancer ([place])    skin ca  . Diffuse cystic mastopathy   . Lupus [facility])     Patient Active Problem List   Diagnosis Date Noted  . Lymphedema 04/13/2018  . Lower extremity pain, bilateral 10/03/2017  . Varicose veins of bilateral lower extremities with pain 10/03/2017  . Swelling of limb 11/22/2016  . Hyperlipidemia 11/22/2016  . DVT (deep venous thrombosis) ([place]) 11/22/2016  . Family history of breast cancer 03/20/2013  . Fibrocystic breast disease 03/20/2013    Past Surgical History:  Procedure Laterality Date  . ABDOMINAL HYSTERECTOMY    . BREAST BIOPSY Left    neg  . BREAST BIOPSY Right 02/2017   neg  . BREAST CYST ASPIRATION Right    neg  . CHOLECYSTECTOMY  2006  . COLONOSCOPY  2013   Dr Dionne Milo   . FACIAL COSMETIC SURGERY    . MITRAL VALVE REPAIR  [address] [facility]  . POLYPECTOMY  2009    OB History    Gravida  3   Para  2   Term      Preterm      AB  1   Living  2     SAB  1   IAB      Ectopic      Multiple      Live Births           Obstetric Comments  1st Menstrual  13 1st Pregnancy:  19          Home Medications    Prior to Admission medications   Medication Sig Start Date End Date Taking? Authorizing Provider  ALPRAZolam Duanne Moron) 0.5 MG tablet Take 0.5 mg by mouth at bedtime.   Yes [provider]  furosemide (LASIX) 40 MG tablet Take 40 mg by mouth daily. 04/05/18  Yes [provider]  lisinopril (PRINIVIL,ZESTRIL) 10 MG tablet Take 10 mg by mouth daily.  06/28/17  Yes [provider]  Metoprolol Succinate (TOPROL XL PO) Take 100 mg by mouth daily.    Yes [provider]  pravastatin (PRAVACHOL) 40 MG tablet Take 40 mg by mouth daily.   Yes [provider]  predniSONE (DELTASONE) 10 MG tablet Take 10 mg by mouth as needed. 03/26/18  Yes [provider]  warfarin (COUMADIN) 5 MG tablet Take 5 mg by mouth daily at 6 PM.   Yes [provider]    Family History Family History  Problem Relation Age of Onset  . Cancer  Mother 66       breast  . Breast cancer Mother   . Heart disease Father     Social History Social History   Tobacco Use  . Smoking status: Never Smoker  . Smokeless tobacco: Never Used  Vaping Use  . Vaping Use: Never used  Substance Use Topics  . Alcohol use: No  . Drug use: No     Allergies   Latex and Morphine   Review of Systems Review of Systems  Constitutional: Negative.   HENT: Negative.   Eyes: Negative.   Respiratory: Negative.   Cardiovascular: Negative.   Gastrointestinal: Negative.   Genitourinary: Negative.   Musculoskeletal: Positive for arthralgias.  Skin: Positive for wound.  Neurological: Negative.   All other systems reviewed and are negative.    Physical Exam Triage Vital Signs ED Triage Vitals  Enc Vitals Group     BP 04/28/20 1602 (!) 141/90     Pulse Rate 04/28/20 1602 64     Resp 04/28/20 1602 18     Temp 04/28/20 1602 98.1 F (36.7 C)     Temp Source 04/28/20 1602 Oral     SpO2 04/28/20 1602 100 %     Weight 04/28/20 1559 180 lb (81.6 kg)      Height 04/28/20 1559 5' 8.5" (1.74 m)     Head Circumference --      Peak Flow --      Pain Score 04/28/20 1558 4     Pain Loc --      Pain Edu? --      Excl. in [place]? --    No data found.  Updated Vital Signs BP (!) 141/90 (BP Location: Left Arm)   Pulse 64   Temp 98.1 F (36.7 C) (Oral)   Resp 18   Ht 5' 8.5" (1.74 m)   Wt 81.6 kg   SpO2 100%   BMI 26.97 kg/m   Visual Acuity Right Eye Distance:   Left Eye Distance:   Bilateral Distance:    Right Eye Near:   Left Eye Near:    Bilateral Near:     Physical Exam Vitals and nursing note reviewed.  Constitutional:      General: She is not in acute distress.    Appearance: Normal appearance. She is not ill-appearing or toxic-appearing.  HENT:     Head: Normocephalic and atraumatic.  Musculoskeletal:        General: Signs of injury present.  Skin:    General: Skin is warm.     Capillary Refill: Capillary refill takes less than 2 seconds.     Findings: Lesion present.     Comments: Patient has a 3 cm open wound that is actively bleeding over the radial aspect of her right thumb at the DIP joint.  There is no evidence of foreign body or infection.  Flexor and extensor tendons are intact.  There is no ligamentous laxity.  The remainder of the right hand exam is within normal limits.  Neurological:     General: No focal deficit present.     Mental Status: She is alert and oriented to person, place, and time.      UC Treatments / Results  Labs (all labs ordered are listed, but only abnormal results are displayed) Labs Reviewed - No data to display  EKG   Radiology No results found.  Procedures Laceration Repair  Date/Time: 04/28/2020 5:13 PM Performed by: Verda Cumins, MD Authorized by: Verda Cumins, MD  Consent:    Consent obtained:  Verbal   Consent given by:  Patient   Risks, benefits, and alternatives were discussed: yes     Risks discussed:  Infection, pain, poor cosmetic result, poor wound  healing, need for additional repair and nerve damage Universal protocol:    Procedure explained and questions answered to patient or proxy's satisfaction: yes     Site/side marked: yes     Immediately prior to procedure, a time out was called: yes     Patient identity confirmed:  Verbally with patient Anesthesia:    Anesthesia method:  Nerve block   Block location:  Local at the laceration site   Block needle gauge:  25 G   Block anesthetic:  Lidocaine 1% w/o epi   Block injection procedure:  Introduced needle   Block outcome:  Anesthesia achieved Laceration details:    Location:  Finger   Finger location:  R thumb   Length (cm):  3   Depth (mm):  1 Pre-procedure details:    Preparation:  Patient was prepped and draped in usual sterile fashion Exploration:    Limited defect created (wound extended): no     Hemostasis achieved with:  Tourniquet   Contaminated: no   Treatment:    Area cleansed with:  Povidone-iodine   Amount of cleaning:  Standard   Visualized foreign bodies/material removed: no     Debridement:  None   Undermining:  None   Scar revision: no   Skin repair:    Repair method:  Sutures   Suture size:  5-0   Suture material:  Prolene   Suture technique:  Retention suture   Number of sutures:  6 Approximation:    Approximation:  Close Repair type:    Repair type:  Intermediate Post-procedure details:    Dressing:  Sterile dressing and adhesive bandage   Procedure completion:  Tolerated well, no immediate complications Comments:     Patient to follow-up in 10 to 14 days for suture removal.  Sooner should she have any problems or symptoms.   (including critical care time)   Medications Ordered in UC Medications  Tdap (BOOSTRIX) injection 0.5 mL (0.5 mLs Intramuscular Given 04/28/20 1653)    Initial Impression / Assessment and Plan / UC Course  I have reviewed the triage vital signs and the nursing notes.  Pertinent labs & imaging results that were  available during my care of the patient were reviewed by me and considered in my medical decision making (see chart for details).  Clinical impression: 3 cm laceration to the right radial thumb at the DIP joint requiring 6 sutures  Treatment plan: 1.  The findings and treatment plan were discussed in detail with the patient.  Patient was in agreement. 2.  Please see procedure above. 3.  Educational handout with instructions were provided. 4.  Follow-up as scheduled for suture removal.    Final Clinical Impressions(s) / UC Diagnoses   Final diagnoses:  Laceration of right thumb without foreign body without damage to nail, initial encounter  Injury of right thumb, initial encounter  Pain of right thumb     Discharge Instructions     You have a laceration to your right thumb. You had 6 sutures placed. I needed to cross sutured them given the location. Also complicating your situation is that you are on Coumadin with a INR of 2.5-3.0. Please see attached wound care instructions. You also received a tetanus booster today. Please keep the wound clean  and dry. We put a pressure bandage on it. You can change that every 48-72 hours. If you have any fever or redness around the wound please come back to the urgent care immediately. Or go to the emergency room. Otherwise come back in 10 to 14 days for suture removal.  I hope you get to feeling better, Dr. Drema Dallas    ED Prescriptions    None     PDMP not reviewed this encounter.   Verda Cumins, MD 04/28/20 [phone]

## 2020-04-30 ENCOUNTER — Ambulatory Visit
Admission: RE | Admit: 2020-04-30 | Discharge: 2020-04-30 | Disposition: A | Discharge status: Home / Self Care | Payer: Medicare HMO | Source: Ambulatory Visit | Attending: General Surgery | Admitting: General Surgery

## 2020-04-30 ENCOUNTER — Other Ambulatory Visit: Payer: Self-pay

## 2020-04-30 DIAGNOSIS — Z1231 Encounter for screening mammogram for malignant neoplasm of breast: Secondary | ICD-10-CM | POA: Diagnosis not present

## 2020-04-30 DIAGNOSIS — N6019 Diffuse cystic mastopathy of unspecified breast: Secondary | ICD-10-CM | POA: Insufficient documentation

## 2020-05-14 ENCOUNTER — Ambulatory Visit
Admission: EM | Admit: 2020-05-14 | Discharge: 2020-05-14 | Disposition: A | Discharge status: Home / Self Care | Payer: Medicare HMO | Attending: Sports Medicine | Admitting: Sports Medicine

## 2020-05-14 ENCOUNTER — Other Ambulatory Visit: Payer: Self-pay

## 2020-05-14 ENCOUNTER — Encounter: Payer: Self-pay | Admitting: Emergency Medicine

## 2020-05-14 DIAGNOSIS — Z4802 Encounter for removal of sutures: Secondary | ICD-10-CM

## 2020-05-14 DIAGNOSIS — S61011D Laceration without foreign body of right thumb without damage to nail, subsequent encounter: Secondary | ICD-10-CM | POA: Diagnosis not present

## 2020-05-14 HISTORY — DX: Essential (primary) hypertension: I10

## 2020-05-14 NOTE — ED Triage Notes (Signed)
Patient in today for suture removal on right thumb. Patient had sutures place 04/28/20. Patient states her thumb was so sore and she didn't want to come back to have sutures removed.

## 2020-05-14 NOTE — ED Provider Notes (Signed)
[facility]    CSN: [id number] Arrival date & time: 05/14/20  1515      History   Chief Complaint Chief Complaint  Patient presents with  . Suture / Staple Removal    HPI Kelli Gonzalez is a 75 y.o. female.   Patient pleasant 75 year old right-hand-dominant female who presents for evaluation of an injury to her right thumb.  Date of injury was 04/26/20. I saw her in the office shortly after the injury and sutured the laceration. Complicating her situation is that Kelli Gonzalez is on Coumadin for atrial fibrillation and mechanical valve and her INR runs between 2.5 and 3.0.  Kelli Gonzalez was cutting slicing an apple with a mandolin at home.  Kelli Gonzalez received a tetanus booster as well.  Kelli Gonzalez was instructed to come back to have the sutures removed in 10-14 days but Kelli Gonzalez said it was painful so Kelli Gonzalez wanted to wait.  Presents today to have sutures removed.  Has been changing her bandage regularly.  No fevers or redness around the wound noted by the patient. No red flag signs or symptoms elicited on history.     Past Medical History:  Diagnosis Date  . Asthma   . Atrial fibrillation ([place])   . Cancer ([place])    skin ca  . Diffuse cystic mastopathy   . Hypertension   . Lupus [facility])     Patient Active Problem List   Diagnosis Date Noted  . Lymphedema 04/13/2018  . Lower extremity pain, bilateral 10/03/2017  . Varicose veins of bilateral lower extremities with pain 10/03/2017  . Swelling of limb 11/22/2016  . Hyperlipidemia 11/22/2016  . DVT (deep venous thrombosis) ([place]) 11/22/2016  . Family history of breast cancer 03/20/2013  . Fibrocystic breast disease 03/20/2013    Past Surgical History:  Procedure Laterality Date  . ABDOMINAL HYSTERECTOMY    . BREAST BIOPSY Left    neg  . BREAST BIOPSY Right 02/2017   neg  . BREAST CYST ASPIRATION Right    neg  . CHOLECYSTECTOMY  2006  . COLONOSCOPY  2013   Dr Dionne Milo   . FACIAL COSMETIC SURGERY    . MITRAL VALVE REPAIR  [address] [facility]  .  POLYPECTOMY  2009    OB History    Gravida  3   Para  2   Term      Preterm      AB  1   Living  2     SAB  1   IAB      Ectopic      Multiple      Live Births           Obstetric Comments  1st Menstrual  13 1st Pregnancy:  19         Home Medications    Prior to Admission medications   Medication Sig Start Date End Date Taking? Authorizing Provider  ALPRAZolam Duanne Moron) 0.5 MG tablet Take 0.5 mg by mouth at bedtime.   Yes [provider]  Calcium Carbonate-Vitamin D 600-400 MG-UNIT tablet Take 1 tablet by mouth daily.   Yes [provider]  furosemide (LASIX) 40 MG tablet Take 40 mg by mouth daily. 04/05/18  Yes [provider]  lisinopril (PRINIVIL,ZESTRIL) 10 MG tablet Take 10 mg by mouth daily.  06/28/17  Yes [provider]  Metoprolol Succinate (TOPROL XL PO) Take 100 mg by mouth daily.    Yes [provider]  Multiple Vitamin (MULTIVITAMIN PO) Take  1 tablet by mouth daily.   Yes [provider]  pravastatin (PRAVACHOL) 40 MG tablet Take 40 mg by mouth daily.   Yes [provider]  warfarin (COUMADIN) 5 MG tablet Take 5 mg by mouth daily at 6 PM.   Yes [provider]  predniSONE (DELTASONE) 10 MG tablet Take 10 mg by mouth as needed. 03/26/18   [provider]    Family History Family History  Problem Relation Age of Onset  . Cancer Mother 5       breast  . Breast cancer Mother   . Heart disease Father     Social History Social History   Tobacco Use  . Smoking status: Never Smoker  . Smokeless tobacco: Never Used  Vaping Use  . Vaping Use: Never used  Substance Use Topics  . Alcohol use: No  . Drug use: No     Allergies   Latex and Morphine   Review of Systems Review of Systems  Constitutional: Negative.  Negative for chills, diaphoresis, fatigue and fever.  HENT: Negative.   Eyes: Negative.   Respiratory: Negative.   Cardiovascular: Negative.    Gastrointestinal: Negative.   Genitourinary: Negative.   Musculoskeletal: Negative.  Negative for arthralgias and joint swelling.  Skin: Positive for wound. Negative for color change, pallor and rash.  Neurological: Negative.  Negative for dizziness and light-headedness.  All other systems reviewed and are negative.    Physical Exam Triage Vital Signs ED Triage Vitals  Enc Vitals Group     BP 05/14/20 1528 109/63     Pulse Rate 05/14/20 1528 74     Resp 05/14/20 1528 18     Temp 05/14/20 1528 97.6 F (36.4 C)     Temp Source 05/14/20 1528 Oral     SpO2 05/14/20 1528 99 %     Weight 05/14/20 1529 180 lb (81.6 kg)     Height 05/14/20 1529 5' 8.5" (1.74 m)     Head Circumference --      Peak Flow --      Pain Score 05/14/20 1527 3     Pain Loc --      Pain Edu? --      Excl. in [place]? --    No data found.  Updated Vital Signs BP 109/63 (BP Location: Left Arm)   Pulse 74   Temp 97.6 F (36.4 C) (Oral)   Resp 18   Ht 5' 8.5" (1.74 m)   Wt 81.6 kg   SpO2 99%   BMI 26.97 kg/m   Visual Acuity Right Eye Distance:   Left Eye Distance:   Bilateral Distance:    Right Eye Near:   Left Eye Near:    Bilateral Near:  Physical Exam Vitals and nursing note reviewed. Constitutional:      General: Kelli Gonzalez is not in acute distress.    Appearance: Normal appearance. Kelli Gonzalez is not ill-appearing or toxic-appearing.  HENT:     Head: Normocephalic and atraumatic.  Musculoskeletal:        General: Signs of injury present.  Skin:    General: Skin is warm.     Capillary Refill: Capillary refill takes less than 2 seconds.     Findings: Lesion present.     Comments: Patient has a well healing wound over the radial aspect of her right thumb at the DIP joint.  There is no evidence of foreign body or infection. Early granulation tissue is present.  Sutures in good position and  wound is healing very well.  Flexor and extensor tendons are intact.  There is no ligamentous laxity.  The remainder  of the right hand exam is within normal limits.  Neurological:     General: No focal deficit present.     Mental Status: Kelli Gonzalez is alert and oriented to person, place, and time.        UC Treatments / Results  Labs (all labs ordered are listed, but only abnormal results are displayed) Labs Reviewed - No data to display  EKG   Radiology No results found.  Procedures Procedures (including critical care time)  Medications Ordered in UC Medications - No data to display  Initial Impression / Assessment and Plan / UC Course  I have reviewed the triage vital signs and the nursing notes.  Pertinent labs & imaging results that were available during my care of the patient were reviewed by me and considered in my medical decision making (see chart for details).  Clinical impression: 3 cm laceration to the right radial thumb at the DIP joint that required 6 sutures.  Wound healing very well.  Treatment plan: 1.  The findings and treatment plan were discussed in detail with the patient.  Patient was in agreement. 2.  Wound was cleaned and soaked for 10 minutes in hydrogen peroxide as the new skin was impeding the ability to remove the sutures as it there was a delay in the patient presenting for followup (16 days). 6 sutures were removed without incident.  Wound was dressed. 3.  Educational handout with instructions were provided. 4.  Follow-up as needed.    Final Clinical Impressions(s) / UC Diagnoses   Final diagnoses:  Visit for suture removal  Laceration of right thumb without foreign body without damage to nail, subsequent encounter     Discharge Instructions     Please keep the area clean and dry until the wound completely heals.  You can change the bandage daily or twice daily. Watch for any signs of infection which include discharge or drainage or redness or increased pain.  If this occurs please seek out medical attention either here, the ER, or with your primary care  physician.    ED Prescriptions    None     PDMP not reviewed this encounter.   Verda Cumins, MD 05/15/20 [phone]

## 2020-05-14 NOTE — Discharge Instructions (Addendum)
Please keep the area clean and dry until the wound completely heals.  You can change the bandage daily or twice daily. Watch for any signs of infection which include discharge or drainage or redness or increased pain.  If this occurs please seek out medical attention either here, the ER, or with your primary care physician.

## 2020-12-21 ENCOUNTER — Other Ambulatory Visit: Payer: Self-pay | Admitting: Family Medicine

## 2020-12-21 DIAGNOSIS — Z1231 Encounter for screening mammogram for malignant neoplasm of breast: Secondary | ICD-10-CM

## 2021-05-03 ENCOUNTER — Ambulatory Visit: Payer: Medicare HMO

## 2021-06-07 ENCOUNTER — Other Ambulatory Visit: Payer: Self-pay | Admitting: Family Medicine

## 2021-06-07 DIAGNOSIS — Z1231 Encounter for screening mammogram for malignant neoplasm of breast: Secondary | ICD-10-CM

## 2021-06-30 ENCOUNTER — Ambulatory Visit
Admission: RE | Admit: 2021-06-30 | Discharge: 2021-06-30 | Disposition: A | Discharge status: Home / Self Care | Payer: Medicare HMO | Source: Ambulatory Visit | Attending: Family Medicine | Admitting: Family Medicine

## 2021-06-30 DIAGNOSIS — Z1231 Encounter for screening mammogram for malignant neoplasm of breast: Secondary | ICD-10-CM | POA: Diagnosis present

## 2021-09-20 ENCOUNTER — Ambulatory Visit
Admission: EM | Admit: 2021-09-20 | Discharge: 2021-09-20 | Disposition: A | Discharge status: Home / Self Care | Payer: Medicare HMO | Attending: Emergency Medicine | Admitting: Emergency Medicine

## 2021-09-20 DIAGNOSIS — L237 Allergic contact dermatitis due to plants, except food: Secondary | ICD-10-CM

## 2021-09-20 MED ORDER — PREDNISONE 10 MG PO TABS: ORAL_TABLET | ORAL | 0 refills | Status: AC

## 2021-09-20 MED ORDER — MUPIROCIN 2 % EX OINT: 1.0000 | TOPICAL_OINTMENT | Freq: Two times a day (BID) | CUTANEOUS | 0 refills | Status: AC

## 2021-09-20 NOTE — ED Triage Notes (Signed)
Pt c/o poison oak x2days.  [place] who were eating [place]. Pt states that it is along her eyes, cheeks, neck, arms, and palms.  Pt states that it was spreading.

## 2021-09-20 NOTE — Discharge Instructions (Addendum)
Use TecNu before going out in areas with known poison ivy/oak.  This will help prevent you from getting poison ivy/oak.  If you get a rash, you can use Zanfel or TecNu extreme to deactivate the oil, which will stop the rash from spreading and help with the itching.  Apply antibacterial ointment on scabbed areas to help prevent infection.  If you were given steroids, make sure you finish all of them.  You may take Claritin or Zyrtec during the day, Benadryl at night. Dissolve 1 packet (or tablet) of Domeboro (aluminum acetate) in 1 pint of luke-warm water. Soak the affected areas with luke-warm Domeboro solution for 5-10 minutes twice daily. You may use apply gauze soaked in the domeboro.  Gently pat dry, Then apply the steriod / antibiotic cream. You may also take oatmeal baths with Aveeno oatmeal (1 cup in half full bathtub) or cornstarch/baking soda (1 cup each in half full bathtub). To prevent the oatmeal from caking in pipes, place it in a tied sock before dropping it into the bathtub.  Go to www.goodrx.com to look up your medications. This will give you a list of where you can find your prescriptions at the most affordable prices. Or ask the pharmacist what the cash price is, or if they have any other discount programs available to help make your medication more affordable. This can be less expensive than what you would pay with insurance.

## 2021-09-20 NOTE — ED Provider Notes (Signed)
HPI  SUBJECTIVE:  Kelli Gonzalez is a 76 y.o. female who presents with an erythematous, intensely pruritic red vesicular rash that is spreading after picking up a goat that had gotten into some poison oak 2 days ago.  She states that she is very allergic to poison oak.  She states the rashes on her neck, face, above left thigh, forearms, palms of her hands and fingers.  She states her left eye was swollen shut this morning.  She denies eye pain, visual changes, photophobia.  She tried calamine and Benadryl.  Calamine helps somewhat.  No aggravating factors.  Patient has a past medical history of asthma, atrial fibrillation, hypertension, lupus, status post mitral valve replacement, on Coumadin.  Her most recent INR a week or 2 ago was therapeutic.  No history of chronic kidney disease.  PCP: [facility]  Past Medical History:  Diagnosis Date   Asthma    Atrial fibrillation ([place])    Cancer ([place])    skin ca   Diffuse cystic mastopathy    Hypertension    Lupus ([place])     Past Surgical History:  Procedure Laterality Date   ABDOMINAL HYSTERECTOMY     AUGMENTATION MAMMAPLASTY Bilateral    Pt had implants but they have been removed   BREAST BIOPSY Left    neg   BREAST BIOPSY Right 02/2017   neg   BREAST CYST ASPIRATION Right    neg   BREAST EXCISIONAL BIOPSY Left    CHOLECYSTECTOMY  2006   COLONOSCOPY  2013   Dr Dionne Milo    FACIAL COSMETIC SURGERY     MITRAL VALVE REPAIR  2002   [facility]   POLYPECTOMY  2009    Family History  Problem Relation Age of Onset   Cancer Mother 41       breast   Breast cancer Mother    Heart disease Father     Social History   Tobacco Use   Smoking status: Never   Smokeless tobacco: Never  Vaping Use   Vaping Use: Never used  Substance Use Topics   Alcohol use: No   Drug use: No    No current facility-administered medications for this encounter.  Current Outpatient Medications:    ALPRAZolam (XANAX) 0.5 MG tablet, Take 0.5 mg  by mouth at bedtime., Disp: , Rfl:    Calcium Carbonate-Vitamin D 600-400 MG-UNIT tablet, Take 1 tablet by mouth daily., Disp: , Rfl:    furosemide (LASIX) 40 MG tablet, Take 40 mg by mouth daily., Disp: , Rfl:    lisinopril (PRINIVIL,ZESTRIL) 10 MG tablet, Take 10 mg by mouth daily. , Disp: , Rfl: 3   Metoprolol Succinate (TOPROL XL PO), Take 100 mg by mouth daily. , Disp: , Rfl:    Multiple Vitamin (MULTIVITAMIN PO), Take 1 tablet by mouth daily., Disp: , Rfl:    mupirocin ointment (BACTROBAN) 2 %, Apply 1 Application topically 2 (two) times daily., Disp: 22 g, Rfl: 0   pravastatin (PRAVACHOL) 40 MG tablet, Take 40 mg by mouth daily., Disp: , Rfl:    predniSONE (DELTASONE) 10 MG tablet, 6 tabs on day 1-2, 5 tabs on day 3-4, 4 tabs on day 5-6, 3 tabs on day 7-8, 2 tabs day 9-10, 1 tab day 11-12, Disp: 42 tablet, Rfl: 0   warfarin (COUMADIN) 5 MG tablet, Take 5 mg by mouth daily at 6 PM., Disp: , Rfl:   Allergies  Allergen Reactions   Latex Rash  blisters   Morphine Itching     ROS  As noted in HPI.   Physical Exam  BP (!) 151/76 (BP Location: Left Arm)   Pulse (!) 53   Temp (!) 97.4 F (36.3 C) (Oral)   Resp 18   Ht '5\' 8"'$  (1.727 m)   Wt 81.6 kg   SpO2 96%   BMI 27.37 kg/m   Constitutional: Well developed, well nourished, no acute distress Eyes:  EOMI, conjunctiva normal bilaterally HENT: Normocephalic, atraumatic,mucus membranes moist Respiratory: Normal inspiratory effort Cardiovascular: Normal rate GI: nondistended skin: Nontender erythematous vesicular rash on hands, forearms, face.          Positive erythema superior to left eye.  Mild swelling   Musculoskeletal: no deformities Neurologic: Alert & oriented x 3, no focal neuro deficits Psychiatric: Speech and behavior appropriate   ED Course   Medications - No data to display  No orders of the defined types were placed in this encounter.   No results found for this or any previous visit (from  the past 24 hour(s)). No results found.  ED Clinical Impression  1. Poison oak dermatitis      ED Assessment/Plan  Patient with poison ivy/poison oak dermatitis.  Home with prednisone for 12 days, Claritin or Zyrtec, and Bactroban.  Follow-up with PCP as needed.  Advised Zanfel or Tecnu scrub.  Patient denies chronic kidney disease.  Unable to find kidney function in epic or in care everywhere.  Discussed MDM, treatment plan, and plan for follow-up with patient. patient agrees with plan.   Meds ordered this encounter  Medications   predniSONE (DELTASONE) 10 MG tablet    Sig: 6 tabs on day 1-2, 5 tabs on day 3-4, 4 tabs on day 5-6, 3 tabs on day 7-8, 2 tabs day 9-10, 1 tab day 11-12    Dispense:  42 tablet    Refill:  0   mupirocin ointment (BACTROBAN) 2 %    Sig: Apply 1 Application topically 2 (two) times daily.    Dispense:  22 g    Refill:  0      *This clinic note was created using [employer]. Therefore, there may be occasional mistakes despite careful proofreading.  ?    Melynda Ripple, MD 09/20/21 1650

## 2021-09-23 ENCOUNTER — Other Ambulatory Visit: Payer: Self-pay | Admitting: Family Medicine

## 2021-09-23 DIAGNOSIS — N644 Mastodynia: Secondary | ICD-10-CM

## 2021-10-01 ENCOUNTER — Ambulatory Visit
Admission: RE | Admit: 2021-10-01 | Discharge: 2021-10-01 | Disposition: A | Discharge status: Home / Self Care | Payer: Medicare HMO | Source: Ambulatory Visit | Attending: Family Medicine | Admitting: Family Medicine

## 2021-10-01 DIAGNOSIS — N644 Mastodynia: Secondary | ICD-10-CM | POA: Diagnosis present

## 2022-09-09 ENCOUNTER — Ambulatory Visit
Admission: EM | Admit: 2022-09-09 | Discharge: 2022-09-09 | Payer: Medicare HMO | Attending: Emergency Medicine | Admitting: Emergency Medicine

## 2022-09-09 DIAGNOSIS — M25471 Effusion, right ankle: Secondary | ICD-10-CM

## 2022-09-09 DIAGNOSIS — M25571 Pain in right ankle and joints of right foot: Secondary | ICD-10-CM | POA: Diagnosis not present

## 2022-09-09 DIAGNOSIS — T148XXA Other injury of unspecified body region, initial encounter: Secondary | ICD-10-CM | POA: Diagnosis not present

## 2022-09-09 NOTE — Discharge Instructions (Signed)
Please go to the [facility] or [facility] emergency department right now.  Because we do not know exactly what bit you, I do not have the capabilities to evaluate for bleeding or other problems caused by snakebites.  I also think you would benefit from some observation to make sure that this does not progress.

## 2022-09-09 NOTE — ED Triage Notes (Addendum)
Pt presents for animal bite, [place] felt the bite about x1 hr ago, pt was bit at RT ankle, lots of swelling. Pt states she did not see what bit her but states area burning real bad. Pt denies any trouble breathing, no SOB, no trouble swallowing.

## 2022-09-09 NOTE — ED Provider Notes (Signed)
HPI  SUBJECTIVE:  Kelli Gonzalez is a 77 y.o. 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  This occurred just before arrival.  She is not sure if it was a snake or a spider as she did not see what bit her.  States that she felt a sharp pain immediately followed by severe burning pain.  She states that they have both venomous snakes and spiders in the pasture.  She denies fevers, body aches, headaches, wheezing, coughing, nausea, vomiting, angioedema, shortness of breath, abdominal pain.  No aggravating or alleviating factors.  She has not tried anything for her symptoms.  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   Past Medical History:  Diagnosis Date   Asthma    Atrial fibrillation (HCC)    Cancer (HCC)    skin ca   Diffuse cystic mastopathy    Hypertension    Lupus (HCC)     Past Surgical History:  Procedure Laterality Date   ABDOMINAL HYSTERECTOMY     AUGMENTATION MAMMAPLASTY Bilateral    Pt had implants but they have been removed   BREAST BIOPSY Left    neg   BREAST BIOPSY Right 02/2017   neg   BREAST CYST ASPIRATION Right    neg   BREAST EXCISIONAL BIOPSY Left    CHOLECYSTECTOMY  2006   COLONOSCOPY  2013   Dr Niel Hummer    FACIAL COSMETIC SURGERY     MITRAL VALVE REPAIR  2002   [facility]   POLYPECTOMY  2009    Family History  Problem Relation Age of Onset   Cancer Mother 58       breast   Breast cancer Mother    Heart disease Father     Social History   Tobacco Use   Smoking status: Never   Smokeless tobacco: Never  Vaping Use   Vaping Use: Never used  Substance Use Topics   Alcohol use: No   Drug  use: No    No current facility-administered medications for this encounter.  Current Outpatient Medications:    ALPRAZolam (XANAX) 0.5 MG tablet, Take 0.5 mg by mouth at bedtime., Disp: , Rfl:    Calcium Carbonate-Vitamin D 600-400 MG-UNIT tablet, Take 1 tablet by mouth daily., Disp: , Rfl:    furosemide (LASIX) 40 MG tablet, Take 40 mg by mouth daily., Disp: , Rfl:    lisinopril (PRINIVIL,ZESTRIL) 10 MG tablet, Take 10 mg by mouth daily. , Disp: , Rfl: 3   Metoprolol Succinate (TOPROL XL PO), Take 100 mg by mouth daily. , Disp: , Rfl:    Multiple Vitamin (MULTIVITAMIN PO), Take 1 tablet by mouth daily., Disp: , Rfl:    mupirocin ointment (BACTROBAN) 2 %, Apply 1 Application topically 2 (two) times daily., Disp: 22 g, Rfl: 0   pravastatin (PRAVACHOL) 40 MG tablet, Take 40 mg by mouth daily., Disp: , Rfl:    predniSONE (DELTASONE) 10 MG tablet, 6 tabs on day 1-2, 5 tabs on day 3-4, 4 tabs on day 5-6, 3 tabs on day 7-8, 2 tabs day 9-10, 1 tab day 11-12, Disp: 42 tablet, Rfl: 0   warfarin (COUMADIN) 5 MG tablet, Take 5 mg by  mouth daily at 6 PM., Disp: , Rfl:   Allergies  Allergen Reactions   Latex Rash    blisters   Morphine Itching     ROS  As noted in HPI.   Physical Exam  BP (!) 167/74 (BP Location: Left Arm)   Pulse 62   Temp 97.9 F (36.6 C) (Oral)   SpO2 99%   Constitutional: Well developed, well nourished, no acute distress Eyes:  EOMI, conjunctiva normal bilaterally HENT: Normocephalic, atraumatic,mucus membranes moist Respiratory: Normal inspiratory effort Cardiovascular: Normal rate GI: nondistended skin: No rash, skin intact Musculoskeletal: Skin appears intact over lateral right ankle.  Positive diffuse tenderness, swelling lateral ankle.  Purplish discoloration, this appears chronic.  No necrosis.  No erythema.     Neurologic: Alert & oriented x 3, no focal neuro deficits Psychiatric: Speech and behavior appropriate   ED Course   Medications - No  data to display  No orders of the defined types were placed in this encounter.   No results found for this or any previous visit (from the past 24 hour(s)). No results found.  ED Clinical Impression  1. Acute right ankle pain   2. Right ankle swelling   3. Bite      ED Assessment/Plan     I am unable to appreciate any puncture wounds, but she is very tender and swollen over the lateral ankle.  She does have some purplish discoloration, but is not sure if this is new or not.  Transferring to the emergency department for observation and to evaluate for possible snakebite.  We do not have the capabilities to run coagulation profiles here.  Patient is stable to go by private vehicle.  Vitals are normal, she has no difficulty breathing.  Discussed rationale for transfer to the emergency department with the patient.  She agrees with plan.  No orders of the defined types were placed in this encounter.     *This clinic note was created using Dragon dictation software. Therefore, there may be occasional mistakes despite careful proofreading.  ?    Domenick Gong, MD 09/09/22 2009

## 2022-09-09 NOTE — ED Notes (Signed)
Patient is being discharged from the Urgent Care and sent to the Emergency Department via POV . Per Domenick Gong, MD, patient is in need of higher level of care due to unknown bite, possible snake bite. Patient is aware and verbalizes understanding of plan of care.  Vitals:   09/09/22 1855  BP: (!) 167/74  Pulse: 62  Temp: 97.9 F (36.6 C)  SpO2: 99%
